# Patient Record
Sex: Female | Born: 1975 | Race: White | Hispanic: No | Marital: Married | Smoking: Former smoker
Health system: Southern US, Community
[De-identification: ages and names within clinical notes are randomized; demographics above are authoritative.]

## PROBLEM LIST (undated history)

## (undated) DIAGNOSIS — E785 Hyperlipidemia, unspecified: Secondary | ICD-10-CM

## (undated) DIAGNOSIS — Z8673 Personal history of transient ischemic attack (TIA), and cerebral infarction without residual deficits: Secondary | ICD-10-CM

## (undated) DIAGNOSIS — I10 Essential (primary) hypertension: Secondary | ICD-10-CM

## (undated) DIAGNOSIS — R519 Headache, unspecified: Secondary | ICD-10-CM

## (undated) HISTORY — DX: Headache, unspecified: R51.9

## (undated) HISTORY — DX: Personal history of transient ischemic attack (TIA), and cerebral infarction without residual deficits: Z86.73

## (undated) HISTORY — DX: Hyperlipidemia, unspecified: E78.5

## (undated) HISTORY — DX: Essential (primary) hypertension: I10

---

## 1997-11-18 ENCOUNTER — Other Ambulatory Visit: Admission: RE | Admit: 1997-11-18 | Discharge: 1997-11-18 | Payer: Self-pay | Admitting: *Deleted

## 2015-07-05 HISTORY — PX: CARDIAC CATHETERIZATION: SHX172

## 2017-04-17 DIAGNOSIS — Z0000 Encounter for general adult medical examination without abnormal findings: Secondary | ICD-10-CM

## 2017-04-17 DIAGNOSIS — E039 Hypothyroidism, unspecified: Secondary | ICD-10-CM

## 2017-04-17 HISTORY — DX: Hypothyroidism, unspecified: E03.9

## 2017-04-17 HISTORY — DX: Encounter for general adult medical examination without abnormal findings: Z00.00

## 2017-06-14 DIAGNOSIS — M5416 Radiculopathy, lumbar region: Secondary | ICD-10-CM

## 2017-06-14 DIAGNOSIS — M47816 Spondylosis without myelopathy or radiculopathy, lumbar region: Secondary | ICD-10-CM

## 2017-06-14 HISTORY — DX: Spondylosis without myelopathy or radiculopathy, lumbar region: M47.816

## 2017-06-14 HISTORY — DX: Radiculopathy, lumbar region: M54.16

## 2017-09-14 DIAGNOSIS — G43909 Migraine, unspecified, not intractable, without status migrainosus: Secondary | ICD-10-CM

## 2017-09-14 HISTORY — DX: Migraine, unspecified, not intractable, without status migrainosus: G43.909

## 2017-10-25 DIAGNOSIS — J029 Acute pharyngitis, unspecified: Secondary | ICD-10-CM

## 2017-10-25 HISTORY — DX: Acute pharyngitis, unspecified: J02.9

## 2017-11-03 DIAGNOSIS — M7582 Other shoulder lesions, left shoulder: Secondary | ICD-10-CM

## 2017-11-03 HISTORY — DX: Other shoulder lesions, left shoulder: M75.82

## 2019-01-16 DIAGNOSIS — J019 Acute sinusitis, unspecified: Secondary | ICD-10-CM | POA: Insufficient documentation

## 2019-01-16 HISTORY — DX: Acute sinusitis, unspecified: J01.90

## 2019-06-13 DIAGNOSIS — N920 Excessive and frequent menstruation with regular cycle: Secondary | ICD-10-CM | POA: Insufficient documentation

## 2019-06-13 HISTORY — DX: Excessive and frequent menstruation with regular cycle: N92.0

## 2019-11-11 DIAGNOSIS — R200 Anesthesia of skin: Secondary | ICD-10-CM | POA: Diagnosis not present

## 2019-11-11 DIAGNOSIS — D473 Essential (hemorrhagic) thrombocythemia: Secondary | ICD-10-CM

## 2019-11-11 DIAGNOSIS — G8194 Hemiplegia, unspecified affecting left nondominant side: Secondary | ICD-10-CM

## 2019-11-11 DIAGNOSIS — E876 Hypokalemia: Secondary | ICD-10-CM | POA: Diagnosis not present

## 2019-11-12 ENCOUNTER — Other Ambulatory Visit: Payer: Self-pay

## 2019-11-13 ENCOUNTER — Ambulatory Visit (INDEPENDENT_AMBULATORY_CARE_PROVIDER_SITE_OTHER): Payer: BC Managed Care – PPO

## 2019-11-13 ENCOUNTER — Ambulatory Visit: Payer: BC Managed Care – PPO | Admitting: Cardiology

## 2019-11-13 ENCOUNTER — Encounter: Payer: Self-pay | Admitting: Cardiology

## 2019-11-13 ENCOUNTER — Other Ambulatory Visit: Payer: Self-pay

## 2019-11-13 VITALS — BP 132/100 | HR 88 | Ht 66.5 in | Wt 171.0 lb

## 2019-11-13 DIAGNOSIS — Z8673 Personal history of transient ischemic attack (TIA), and cerebral infarction without residual deficits: Secondary | ICD-10-CM

## 2019-11-13 DIAGNOSIS — Q2112 Patent foramen ovale: Secondary | ICD-10-CM

## 2019-11-13 DIAGNOSIS — Q211 Atrial septal defect: Secondary | ICD-10-CM | POA: Diagnosis not present

## 2019-11-13 DIAGNOSIS — R42 Dizziness and giddiness: Secondary | ICD-10-CM | POA: Diagnosis not present

## 2019-11-13 DIAGNOSIS — I10 Essential (primary) hypertension: Secondary | ICD-10-CM | POA: Diagnosis not present

## 2019-11-13 DIAGNOSIS — G43009 Migraine without aura, not intractable, without status migrainosus: Secondary | ICD-10-CM

## 2019-11-13 MED ORDER — AMLODIPINE BESYLATE 5 MG PO TABS
5.0000 mg | ORAL_TABLET | Freq: Every day | ORAL | 3 refills | Status: DC
Start: 2019-11-13 — End: 2021-06-14

## 2019-11-13 MED ORDER — ATORVASTATIN CALCIUM 20 MG PO TABS
20.0000 mg | ORAL_TABLET | Freq: Every day | ORAL | 3 refills | Status: DC
Start: 2019-11-13 — End: 2020-11-09

## 2019-11-13 NOTE — Progress Notes (Addendum)
Cardiology Office Note:    Date:  11/17/2019   ID:  Diane Evans, DOB 10-24-1975, MRN [id number]  PCP:  Myrlene Broker, MD  Cardiologist:  Berniece Salines, DO  Electrophysiologist:  None   Referring MD: Myrlene Broker, MD   Chief Complaint  Patient presents with  . Hospitalization Follow-up  " I am doing a lot better since I left the hospital "  History of Present Illness:    Diane Evans is a 44 y.o. female with a hx of recent leg numbness where she was admitted at the [facility] with CT and MR reported to be negative, her echocardiogram showed diastolic dysfunction as well as positive bubble study noting PFO, elevated blood pressure reading, migraine headaches and hypothyroidism.  The patient tells me that at the day of the incident she was at work when she felt her left lower extremity and left upper extremity feeling numb as well.  She reported this to her coworkers who immediately took her downstairs to the emergency department after taking her blood pressure and her systolic blood pressure was noted to be [id number] her diastolic in the 0000000.  The patient reports that in addition she tells me that she has been experiencing intermittent dizziness prior to her episode.  She also reports that she has had bulging disks in her lower back which causes her for extremity problems at times.  Since she left the hospital she has not had any repeated symptoms.  Past Medical History:  Diagnosis Date  . Headache   . History of TIA (transient ischemic attack)     Past Surgical History:  Procedure Laterality Date  . CARDIAC CATHETERIZATION  2017   No stents put in    Current Medications: Current Meds  Medication Sig  . ASPIRIN 81 81 MG chewable tablet daily.  . cyclobenzaprine (FLEXERIL) 10 MG tablet Take 10 mg by mouth 3 (three) times daily.  Marland Kitchen levothyroxine (SYNTHROID) 75 MCG tablet Take by mouth daily.  . rizatriptan (MAXALT) 5 MG tablet Take by mouth daily.  . traMADol  (ULTRAM) 50 MG tablet Take by mouth as needed.     Allergies:   Patient has no known allergies.   Social History   Socioeconomic History  . Marital status: Unknown    Spouse name: Not on file  . Number of children: Not on file  . Years of education: Not on file  . Highest education level: Not on file  Occupational History  . Not on file  Tobacco Use  . Smoking status: Former Smoker    Types: Cigarettes    Quit date: 11/13/1999    Years since quitting: 20.0  . Smokeless tobacco: Never Used  Substance and Sexual Activity  . Alcohol use: Not on file    Comment: Occasional  . Drug use: Never  . Sexual activity: Not on file  Other Topics Concern  . Not on file  Social History Narrative  . Not on file   Social Determinants of Health   Financial Resource Strain:   . Difficulty of Paying Living Expenses:   Food Insecurity:   . Worried About Charity fundraiser in the Last Year:   . Arboriculturist in the Last Year:   Transportation Needs:   . Film/video editor (Medical):   Marland Kitchen Lack of Transportation (Non-Medical):   Physical Activity:   . Days of Exercise per Week:   . Minutes of Exercise per Session:   Stress:   .  Feeling of Stress :   Social Connections:   . Frequency of Communication with Friends and Family:   . Frequency of Social Gatherings with Friends and Family:   . Attends Religious Services:   . Active Member of Clubs or Organizations:   . Attends Archivist Meetings:   Marland Kitchen Marital Status:      Family History: The patient's family history includes Heart attack in her maternal grandfather; Heart murmur in her mother.  ROS:   Review of Systems  Constitution: Negative for decreased appetite, fever and weight gain.  HENT: Negative for congestion, ear discharge, hoarse voice and sore throat.   Eyes: Negative for discharge, redness, vision loss in right eye and visual halos.  Cardiovascular: Negative for chest pain, dyspnea on exertion, leg  swelling, orthopnea and palpitations.  Respiratory: Negative for cough, hemoptysis, shortness of breath and snoring.   Endocrine: Negative for heat intolerance and polyphagia.  Hematologic/Lymphatic: Negative for bleeding problem. Does not bruise/bleed easily.  Skin: Negative for flushing, nail changes, rash and suspicious lesions.  Musculoskeletal: Negative for arthritis, joint pain, muscle cramps, myalgias, neck pain and stiffness.  Gastrointestinal: Negative for abdominal pain, bowel incontinence, diarrhea and excessive appetite.  Genitourinary: Negative for decreased libido, genital sores and incomplete emptying.  Neurological: Reports headaches. Negative for brief paralysis, focal weakness and loss of balance.  Psychiatric/Behavioral: Negative for altered mental status, depression and suicidal ideas.  Allergic/Immunologic: Negative for HIV exposure and persistent infections.    EKGs/Labs/Other Studies Reviewed:    The following studies were reviewed today:   EKG:  The ekg ordered today demonstrates   TTE Impression Normal lv chamber size and wall thickness. LV EF 55-60%. Grade I Diastolic dysfunction. LA moderately dilated. PFO noted with positive bubble study.  MRI HEAD WITHOUT CONTRAST 11/1019/2 TECHNIQUE: Multiplanar, multiecho pulse sequences of the brain and surrounding structures were obtained without intravenous contrast. COMPARISON:  CT angio head neck 11/10/2019 FINDINGS: Brain: No acute infarction, hemorrhage, hydrocephalus, extra-axial collection or mass lesion. 3 mm hyperintensity left frontal deep white matter. Otherwise normal white matter. Normal brainstem.. Vascular: Normal arterial flow voids Skull and upper cervical spine: No focal skeletal lesion. Sinuses/Orbits: Negative Other: None IMPRESSION: No acute abnormality. Solitary 3 mm left frontal white matter hyperintensity is nonspecific but may be related to chronic microvascular ischemia. No features  characteristic of demyelinating disease.   CTA NECK FINDINGS 11/10/2019 Aortic arch: Examination degraded by motion artifact and timing of the contrast bolus. Visualized aortic arch of normal caliber with normal 3 vessel morphology. No flow-limiting stenosis seen about the origin of the great vessels. Visualized subclavian arteries widely patent. Right carotid system: Right common and internal carotid arteries grossly patent without stenosis, dissection, or occlusion. Evaluation limited by motion. Left carotid system: Left common and internal carotid arteries grossly patent without stenosis, dissection or occlusion. Evaluation limited by motion. Vertebral arteries: Both vertebral arteries arise from the subclavian arteries. Left vertebral artery dominant. Vertebral arteries patent without stenosis, dissection or occlusion. Evaluation limited by motion. Skeleton: No acute osseous abnormality. No visible discrete osseous lesions. Other neck: No other acute soft tissue abnormality within the neck. Upper chest: Visualized upper chest demonstrates no acute finding. Review of the MIP images confirms the above findings  CTA HEAD FINDINGS  Anterior circulation: Examination markedly degraded by motion and timing of the contrast bolus.  Internal carotid arteries grossly patent to the termini without appreciable stenosis or other abnormality. A1 segments patent. Normal anterior communicating artery complex anterior cerebral arteries grossly  patent to their distal aspects without stenosis. No M1 stenosis or occlusion. Normal MCA bifurcations. Distal MCA branches well perfused and symmetric.  Posterior circulation: Vertebral arteries patent to the vertebrobasilar junction without stenosis. Both picas patent. Basilar patent to its distal aspect without stenosis. Superior cerebral arteries patent bilaterally. Both PCAs primarily supplied via the basilar well perfused to their distal  aspects. Venous sinuses: Patent. Anatomic variants: None significant. Review of the MIP images confirms the above findings IMPRESSION: 1. Technically limited exam due to motion artifact and timing the contrast bolus. 2. Grossly negative CTA of the neck. No large vessel occlusion, hemodynamically significant stenosis, or other acute vascular abnormality.    Recent Labs: CBC: WBC 7.8, hemoglobin 13.4, hematocrit 40.8, platelet 421 Chemistry: Sodium 140, potassium 3.4, chloride 100, bicarb 28, creatinine 0.80, BUN 13, glucose 98 Recent Lipid Panel No results found for: CHOL, TRIG, HDL, CHOLHDL, VLDL, LDLCALC, LDLDIRECT  Physical Exam:    VS:  BP (!) 132/100 (BP Location: Left Arm, Patient Position: Sitting, Cuff Size: Normal)   Pulse 88   Ht 5' 6.5" (1.689 m)   Wt 171 lb (77.6 kg)   SpO2 99%   BMI 27.19 kg/m     Wt Readings from Last 3 Encounters:  11/13/19 171 lb (77.6 kg)     GEN: Well nourished, well developed in no acute distress HEENT: Normal NECK: No JVD; No carotid bruits LYMPHATICS: No lymphadenopathy CARDIAC: S1S2 noted,RRR, no murmurs, rubs, gallops RESPIRATORY:  Clear to auscultation without rales, wheezing or rhonchi  ABDOMEN: Soft, non-tender, non-distended, +bowel sounds, no guarding. EXTREMITIES: No edema, No cyanosis, no clubbing MUSCULOSKELETAL:  No deformity  SKIN: Warm and dry, skin with tattoos NEUROLOGIC:  Alert and oriented x 3, non-focal PSYCHIATRIC:  Normal affect, good insight  ASSESSMENT:    1. Dizziness   2. History of TIA (transient ischemic attack)   3. PFO (patent foramen ovale)   4. Essential hypertension   5. Migraine without aura and without status migrainosus, not intractable    PLAN:    For her dizziness, I will place a monitor on the patient to help understand and make sure that she does not have any cardiac arhythmia that may be causing her symptoms. I will place the monitor on the patient for 14 days.  She is hypertensive in  the office - I do believe that the patient have hypertension given her two isolated elevated blood pressure reading - so I am going to start her on Amlodipine 5mg  daily.   Her MRI did not show any acute infarction but have some suspicion of nonspecific finding which could be related to chronic microvascular ischemia in her left frontal white matter.  She was placed on aspirin for history of TIA some also I am  going to add atorvastatin 20 mg daily.   I think she should see a neurologist for now for her migraine headaches - as her episode may have been as a result of complicated migraine.  She was concerned about her migraines and PFO findings I did discuss with the patient that patients with PFO do sometimes have migraines.  But at this time I do think she should consider medical therapy and if refractory then we can pursue the possibility of PFO closure . At which time a TEE will be performed prior to evaluation by a structural/interventional cardiologist. In the meantime she will follow up with a neurologist.  For now we have discussed even with her wife on the phone that TEE  at this time would not change her plan of care- since she has not been treated medically for her migraines.  The patient her wife both agree with this.  The patient is in agreement with the above plan. The patient left the office in stable condition.  The patient will follow up in 1 month.   Medication Adjustments/Labs and Tests Ordered: Current medicines are reviewed at length with the patient today.  Concerns regarding medicines are outlined above.  Orders Placed This Encounter  Procedures  . LONG TERM MONITOR (3-14 DAYS)  . EKG 12-Lead   Meds ordered this encounter  Medications  . amLODipine (NORVASC) 5 MG tablet    Sig: Take 1 tablet (5 mg total) by mouth daily.    Dispense:  90 tablet    Refill:  3  . atorvastatin (LIPITOR) 20 MG tablet    Sig: Take 1 tablet (20 mg total) by mouth daily.    Dispense:  90  tablet    Refill:  3    Patient Instructions  Medication Instructions:  Your physician has recommended you make the following change in your medication:  START: Amlodipine 5 mg take one tablet by mouth daily.  START: Lipitor 20 mg take one tablet by mouth daily.  *If you need a refill on your cardiac medications before your next appointment, please call your pharmacy*   Lab Work: None If you have labs (blood work) drawn today and your tests are completely normal, you will receive your results only by: Marland Kitchen MyChart Message (if you have MyChart) OR . A paper copy in the mail If you have any lab test that is abnormal or we need to change your treatment, we will call you to review the results.   Testing/Procedures: A zio monitor was ordered today. It will remain on for 14 days. You will then return monitor and event diary in provided box. It takes 1-2 weeks for report to be downloaded and returned to [place]. We will call you with the results. If monitor falls off or has orange flashing light, please call Zio for further instructions.      Follow-Up: At [facility], you and your health needs are our priority.  As part of our continuing mission to provide you with exceptional heart care, we have created designated Provider Care Teams.  These Care Teams include your primary Cardiologist (physician) and Advanced Practice Providers (APPs -  Physician Assistants and Nurse Practitioners) who all work together to provide you with the care you need, when you need it.  We recommend signing up for the patient portal called "MyChart".  Sign up information is provided on this After Visit Summary.  MyChart is used to connect with patients for Virtual Visits (Telemedicine).  Patients are able to view lab/test results, encounter notes, upcoming appointments, etc.  Non-urgent messages can be sent to your provider as well.   To learn more about what you can do with MyChart, go to [email or web address].     Your next appointment:   1 month(s)  The format for your next appointment:   In Person  Provider:   Berniece Salines, DO   Other Instructions      Adopting a Healthy Lifestyle.  Know what a healthy weight is for you (roughly BMI <25) and aim to maintain this   Aim for 7+ servings of fruits and vegetables daily   65-80+ fluid ounces of water or unsweet tea for healthy kidneys   Limit to max 1 drink of  alcohol per day; avoid smoking/tobacco   Limit animal fats in diet for cholesterol and heart health - choose grass fed whenever available   Avoid highly processed foods, and foods high in saturated/trans fats   Aim for low stress - take time to unwind and care for your mental health   Aim for 150 min of moderate intensity exercise weekly for heart health, and weights twice weekly for bone health   Aim for 7-9 hours of sleep daily   When it comes to diets, agreement about the perfect plan isnt easy to find, even among the experts. Experts at the [employer] developed an idea known as the Healthy Eating Plate. Just imagine a plate divided into logical, healthy portions.   The emphasis is on diet quality:   Load up on vegetables and fruits - one-half of your plate: Aim for color and variety, and remember that potatoes dont count.   Go for whole grains - one-quarter of your plate: Whole wheat, barley, wheat berries, quinoa, oats, brown rice, and foods made with them. If you want pasta, go with whole wheat pasta.   Protein power - one-quarter of your plate: Fish, chicken, beans, and nuts are all healthy, versatile protein sources. Limit red meat.   The diet, however, does go beyond the plate, offering a few other suggestions.   Use healthy plant oils, such as olive, canola, soy, corn, sunflower and peanut. Check the labels, and avoid partially hydrogenated oil, which have unhealthy trans fats.   If youre thirsty, drink water. Coffee and tea are good in  moderation, but skip sugary drinks and limit milk and dairy products to one or two daily servings.   The type of carbohydrate in the diet is more important than the amount. Some sources of carbohydrates, such as vegetables, fruits, whole grains, and beans-are healthier than others.   Finally, stay active  Signed, Berniece Salines, DO  11/17/2019 9:10 AM    [place]

## 2019-11-13 NOTE — Patient Instructions (Signed)
Medication Instructions:  Your physician has recommended you make the following change in your medication:  START: Amlodipine 5 mg take one tablet by mouth daily.  START: Lipitor 20 mg take one tablet by mouth daily.  *If you need a refill on your cardiac medications before your next appointment, please call your pharmacy*   Lab Work: None If you have labs (blood work) drawn today and your tests are completely normal, you will receive your results only by: Marland Kitchen MyChart Message (if you have MyChart) OR . A paper copy in the mail If you have any lab test that is abnormal or we need to change your treatment, we will call you to review the results.   Testing/Procedures: A zio monitor was ordered today. It will remain on for 14 days. You will then return monitor and event diary in provided box. It takes 1-2 weeks for report to be downloaded and returned to [place]. We will call you with the results. If monitor falls off or has orange flashing light, please call Zio for further instructions.      Follow-Up: At [facility], you and your health needs are our priority.  As part of our continuing mission to provide you with exceptional heart care, we have created designated Provider Care Teams.  These Care Teams include your primary Cardiologist (physician) and Advanced Practice Providers (APPs -  Physician Assistants and Nurse Practitioners) who all work together to provide you with the care you need, when you need it.  We recommend signing up for the patient portal called "MyChart".  Sign up information is provided on this After Visit Summary.  MyChart is used to connect with patients for Virtual Visits (Telemedicine).  Patients are able to view lab/test results, encounter notes, upcoming appointments, etc.  Non-urgent messages can be sent to your provider as well.   To learn more about what you can do with MyChart, go to [email or web address].    Your next appointment:   1 month(s)  The format  for your next appointment:   In Person  Provider:   Berniece Salines, DO   Other Instructions

## 2019-11-18 DIAGNOSIS — Z09 Encounter for follow-up examination after completed treatment for conditions other than malignant neoplasm: Secondary | ICD-10-CM

## 2019-11-18 DIAGNOSIS — D75838 Other thrombocytosis: Secondary | ICD-10-CM

## 2019-11-18 DIAGNOSIS — E876 Hypokalemia: Secondary | ICD-10-CM

## 2019-11-18 HISTORY — DX: Other thrombocytosis: D75.838

## 2019-11-18 HISTORY — DX: Encounter for follow-up examination after completed treatment for conditions other than malignant neoplasm: Z09

## 2019-11-18 HISTORY — DX: Hypokalemia: E87.6

## 2019-12-13 ENCOUNTER — Encounter: Payer: Self-pay | Admitting: Cardiology

## 2019-12-13 ENCOUNTER — Ambulatory Visit: Payer: BC Managed Care – PPO | Admitting: Cardiology

## 2019-12-13 ENCOUNTER — Other Ambulatory Visit: Payer: Self-pay

## 2019-12-13 VITALS — BP 110/64 | HR 71 | Ht 66.5 in | Wt 164.0 lb

## 2019-12-13 DIAGNOSIS — I10 Essential (primary) hypertension: Secondary | ICD-10-CM | POA: Diagnosis not present

## 2019-12-13 DIAGNOSIS — Q2112 Patent foramen ovale: Secondary | ICD-10-CM

## 2019-12-13 DIAGNOSIS — Z8673 Personal history of transient ischemic attack (TIA), and cerebral infarction without residual deficits: Secondary | ICD-10-CM | POA: Diagnosis not present

## 2019-12-13 DIAGNOSIS — Q211 Atrial septal defect: Secondary | ICD-10-CM | POA: Diagnosis not present

## 2019-12-13 MED ORDER — ASPIRIN 81 81 MG PO CHEW: 81.0000 mg | CHEWABLE_TABLET | Freq: Every day | ORAL | 3 refills | Status: DC

## 2019-12-13 NOTE — Patient Instructions (Signed)
Medication Instructions:  Your physician recommends that you continue on your current medications as directed. Please refer to the Current Medication list given to you today.  *If you need a refill on your cardiac medications before your next appointment, please call your pharmacy*   Lab Work: None ordered   If you have labs (blood work) drawn today and your tests are completely normal, you will receive your results only by: . MyChart Message (if you have MyChart) OR . A paper copy in the mail If you have any lab test that is abnormal or we need to change your treatment, we will call you to review the results.   Testing/Procedures: None ordered    Follow-Up: At [facility], you and your health needs are our priority.  As part of our continuing mission to provide you with exceptional heart care, we have created designated Provider Care Teams.  These Care Teams include your primary Cardiologist (physician) and Advanced Practice Providers (APPs -  Physician Assistants and Nurse Practitioners) who all work together to provide you with the care you need, when you need it.  We recommend signing up for the patient portal called "MyChart".  Sign up information is provided on this After Visit Summary.  MyChart is used to connect with patients for Virtual Visits (Telemedicine).  Patients are able to view lab/test results, encounter notes, upcoming appointments, etc.  Non-urgent messages can be sent to your provider as well.   To learn more about what you can do with MyChart, go to [email or web address].    Your next appointment:   6 month(s)  The format for your next appointment:   In Person  Provider:   Kardie Tobb, DO   Other Instructions None   

## 2019-12-13 NOTE — Progress Notes (Signed)
Cardiology Office Note:    Date:  12/13/2019   ID:  Diane Evans, DOB 1976/07/03, MRN [id number]  PCP:  Myrlene Broker, MD  Cardiologist:  Berniece Salines, DO  Electrophysiologist:  None   Referring MD: Myrlene Broker, MD   " I am doing well"  History of Present Illness:    Diane Evans is a 44 y.o. female with a hx of hypertension, hypothyroidism, migraine headaches, recent concern for TIA which was admitted to [facility] in May 2012 where her echo showed evidence of positive bubble study noting a PFO.  I did see the patient on Nov 13, 2019 at the time she presented to establish cardiac care.  She was hypertensive I started her on amlodipine 5 mg daily.  Also started patient on aspirin and Lipitor 20 mg daily.  We discussed at that time that it would be beneficial for her to pursue medical management for her migraine headaches and if this fails we will pursue investigating her PFO for possible closure.  She preferred not to pursue any TEE.  Today she is here for follow-up visit she tells me that she has been doing well and her blood pressure has significant increase on the amlodipine 5 mg daily.  She has not been able to see neurology.  She offers no complaints at this time.  Past Medical History:  Diagnosis Date  . Headache   . History of TIA (transient ischemic attack)     Past Surgical History:  Procedure Laterality Date  . CARDIAC CATHETERIZATION  2017   No stents put in    Current Medications: Current Meds  Medication Sig  . amLODipine (NORVASC) 5 MG tablet Take 1 tablet (5 mg total) by mouth daily.  . ASPIRIN 81 81 MG chewable tablet Chew 1 tablet (81 mg total) by mouth daily.  Marland Kitchen atorvastatin (LIPITOR) 20 MG tablet Take 1 tablet (20 mg total) by mouth daily.  . cyclobenzaprine (FLEXERIL) 10 MG tablet Take 10 mg by mouth 3 (three) times daily.  Marland Kitchen levothyroxine (SYNTHROID) 75 MCG tablet Take by mouth daily.  . rizatriptan (MAXALT) 5 MG tablet Take by mouth  daily.  . traMADol (ULTRAM) 50 MG tablet Take by mouth as needed.  . [DISCONTINUED] ASPIRIN 81 81 MG chewable tablet daily.     Allergies:   Patient has no known allergies.   Social History   Socioeconomic History  . Marital status: Unknown    Spouse name: Not on file  . Number of children: Not on file  . Years of education: Not on file  . Highest education level: Not on file  Occupational History  . Not on file  Tobacco Use  . Smoking status: Former Smoker    Types: Cigarettes    Quit date: 11/13/1999    Years since quitting: 20.0  . Smokeless tobacco: Never Used  Substance and Sexual Activity  . Alcohol use: Not on file    Comment: Occasional  . Drug use: Never  . Sexual activity: Not on file  Other Topics Concern  . Not on file  Social History Narrative  . Not on file   Social Determinants of Health   Financial Resource Strain:   . Difficulty of Paying Living Expenses:   Food Insecurity:   . Worried About Charity fundraiser in the Last Year:   . Arboriculturist in the Last Year:   Transportation Needs:   . Film/video editor (Medical):   Marland Kitchen Lack of  Transportation (Non-Medical):   Physical Activity:   . Days of Exercise per Week:   . Minutes of Exercise per Session:   Stress:   . Feeling of Stress :   Social Connections:   . Frequency of Communication with Friends and Family:   . Frequency of Social Gatherings with Friends and Family:   . Attends Religious Services:   . Active Member of Clubs or Organizations:   . Attends Archivist Meetings:   Marland Kitchen Marital Status:      Family History: The patient's family history includes Heart attack in her maternal grandfather; Heart murmur in her mother.  ROS:   Review of Systems  Constitution: Negative for decreased appetite, fever and weight gain.  HENT: Negative for congestion, ear discharge, hoarse voice and sore throat.   Eyes: Negative for discharge, redness, vision loss in right eye and visual  halos.  Cardiovascular: Negative for chest pain, dyspnea on exertion, leg swelling, orthopnea and palpitations.  Respiratory: Negative for cough, hemoptysis, shortness of breath and snoring.   Endocrine: Negative for heat intolerance and polyphagia.  Hematologic/Lymphatic: Negative for bleeding problem. Does not bruise/bleed easily.  Skin: Negative for flushing, nail changes, rash and suspicious lesions.  Musculoskeletal: Negative for arthritis, joint pain, muscle cramps, myalgias, neck pain and stiffness.  Gastrointestinal: Negative for abdominal pain, bowel incontinence, diarrhea and excessive appetite.  Genitourinary: Negative for decreased libido, genital sores and incomplete emptying.  Neurological: Negative for brief paralysis, focal weakness, headaches and loss of balance.  Psychiatric/Behavioral: Negative for altered mental status, depression and suicidal ideas.  Allergic/Immunologic: Negative for HIV exposure and persistent infections.    EKGs/Labs/Other Studies Reviewed:    The following studies were reviewed today:   EKG:  None toda [place]   TTE Impression Normal lv chamber size and wall thickness. LV EF 55-60%. Grade I Diastolic dysfunction. LA moderately dilated. PFO noted with positive bubble study.  MRI HEAD WITHOUT CONTRAST 11/1019/2 TECHNIQUE: Multiplanar, multiecho pulse sequences of the brain and surrounding structures were obtained without intravenous contrast. COMPARISON:  CT angio head neck 11/10/2019 FINDINGS: Brain: No acute infarction, hemorrhage, hydrocephalus, extra-axial collection or mass lesion. 3 mm hyperintensity left frontal deep white matter. Otherwise normal white matter. Normal brainstem.. Vascular: Normal arterial flow voids Skull and upper cervical spine: No focal skeletal lesion. Sinuses/Orbits: Negative Other: None IMPRESSION: No acute abnormality. Solitary 3 mm left frontal white matter hyperintensity is nonspecific but may be related to  chronic microvascular ischemia. No features characteristic of demyelinating disease.   CTA NECK FINDINGS 11/10/2019 Aortic arch: Examination degraded by motion artifact and timing of the contrast bolus. Visualized aortic arch of normal caliber with normal 3 vessel morphology. No flow-limiting stenosis seen about the origin of the great vessels. Visualized subclavian arteries widely patent. Right carotid system: Right common and internal carotid arteries grossly patent without stenosis, dissection, or occlusion. Evaluation limited by motion. Left carotid system: Left common and internal carotid arteries grossly patent without stenosis, dissection or occlusion. Evaluation limited by motion. Vertebral arteries: Both vertebral arteries arise from the subclavian arteries. Left vertebral artery dominant. Vertebral arteries patent without stenosis, dissection or occlusion. Evaluation limited by motion. Skeleton: No acute osseous abnormality. No visible discrete osseous lesions. Other neck: No other acute soft tissue abnormality within the neck. Upper chest: Visualized upper chest demonstrates no acute finding. Review of the MIP images confirms the above findings  CTA HEAD FINDINGS  Anterior circulation: Examination markedly degraded by motion and timing of the contrast bolus.  Internal  carotid arteries grossly patent to the termini without appreciable stenosis or other abnormality. A1 segments patent. Normal anterior communicating artery complex anterior cerebral arteries grossly patent to their distal aspects without stenosis. No M1 stenosis or occlusion. Normal MCA bifurcations. Distal MCA branches well perfused and symmetric.  Posterior circulation: Vertebral arteries patent to the vertebrobasilar junction without stenosis. Both picas patent. Basilar patent to its distal aspect without stenosis. Superior cerebral arteries patent bilaterally. Both PCAs primarily supplied via the  basilar well perfused to their distal aspects. Venous sinuses: Patent. Anatomic variants: None significant. Review of the MIP images confirms the above findings IMPRESSION: 1. Technically limited exam due to motion artifact and timing the contrast bolus. 2. Grossly negative CTA of the neck. No large vessel occlusion, hemodynamically significant stenosis, or other acute vascular abnormality. Recent Labs: No results found for requested labs within last 8760 hours.  Recent Lipid Panel No results found for: CHOL, TRIG, HDL, CHOLHDL, VLDL, LDLCALC, LDLDIRECT  Physical Exam:    VS:  BP 110/64   Pulse 71   Ht 5' 6.5" (1.689 m)   Wt 164 lb (74.4 kg)   BMI 26.07 kg/m     Wt Readings from Last 3 Encounters:  12/13/19 164 lb (74.4 kg)  11/13/19 171 lb (77.6 kg)     GEN: Well nourished, well developed in no acute distress HEENT: Normal NECK: No JVD; No carotid bruits LYMPHATICS: No lymphadenopathy CARDIAC: S1S2 noted,RRR, no murmurs, rubs, gallops RESPIRATORY:  Clear to auscultation without rales, wheezing or rhonchi  ABDOMEN: Soft, non-tender, non-distended, +bowel sounds, no guarding. EXTREMITIES: No edema, No cyanosis, no clubbing MUSCULOSKELETAL:  No deformity  SKIN: Warm and dry NEUROLOGIC:  Alert and oriented x 3, non-focal PSYCHIATRIC:  Normal affect, good insight  ASSESSMENT:    1. Essential hypertension   2. History of TIA (transient ischemic attack)   3. PFO (patent foramen ovale)    PLAN:     1.  Her blood pressure is acceptable in the office today.  We will continue the current dose of amlodipine 5 mg daily.  2.  We will continue the aspirin and statin for her TIA.  3.  We spoke again about her positive bubble study with a PFO.  And at this time she still maintains that is best and would rather follow-up with neurology for migraine treatment medically.  The patient is in agreement with the above plan. The patient left the office in stable condition.  The  patient will follow up in   Medication Adjustments/Labs and Tests Ordered: Current medicines are reviewed at length with the patient today.  Concerns regarding medicines are outlined above.  No orders of the defined types were placed in this encounter.  Meds ordered this encounter  Medications  . ASPIRIN 81 81 MG chewable tablet    Sig: Chew 1 tablet (81 mg total) by mouth daily.    Dispense:  90 tablet    Refill:  3    Patient Instructions  Medication Instructions:  Your physician recommends that you continue on your current medications as directed. Please refer to the Current Medication list given to you today.  *If you need a refill on your cardiac medications before your next appointment, please call your pharmacy*   Lab Work: None ordered   If you have labs (blood work) drawn today and your tests are completely normal, you will receive your results only by: Marland Kitchen MyChart Message (if you have MyChart) OR . A paper copy in the mail  If you have any lab test that is abnormal or we need to change your treatment, we will call you to review the results.   Testing/Procedures: None ordered    Follow-Up: At [facility], you and your health needs are our priority.  As part of our continuing mission to provide you with exceptional heart care, we have created designated Provider Care Teams.  These Care Teams include your primary Cardiologist (physician) and Advanced Practice Providers (APPs -  Physician Assistants and Nurse Practitioners) who all work together to provide you with the care you need, when you need it.  We recommend signing up for the patient portal called "MyChart".  Sign up information is provided on this After Visit Summary.  MyChart is used to connect with patients for Virtual Visits (Telemedicine).  Patients are able to view lab/test results, encounter notes, upcoming appointments, etc.  Non-urgent messages can be sent to your provider as well.   To learn more about  what you can do with MyChart, go to [email or web address].    Your next appointment:   6 month(s)  The format for your next appointment:   In Person  Provider:   Berniece Salines, DO   Other Instructions None      Adopting a Healthy Lifestyle.  Know what a healthy weight is for you (roughly BMI <25) and aim to maintain this   Aim for 7+ servings of fruits and vegetables daily   65-80+ fluid ounces of water or unsweet tea for healthy kidneys   Limit to max 1 drink of alcohol per day; avoid smoking/tobacco   Limit animal fats in diet for cholesterol and heart health - choose grass fed whenever available   Avoid highly processed foods, and foods high in saturated/trans fats   Aim for low stress - take time to unwind and care for your mental health   Aim for 150 min of moderate intensity exercise weekly for heart health, and weights twice weekly for bone health   Aim for 7-9 hours of sleep daily   When it comes to diets, agreement about the perfect plan isnt easy to find, even among the experts. Experts at the [employer] developed an idea known as the Healthy Eating Plate. Just imagine a plate divided into logical, healthy portions.   The emphasis is on diet quality:   Load up on vegetables and fruits - one-half of your plate: Aim for color and variety, and remember that potatoes dont count.   Go for whole grains - one-quarter of your plate: Whole wheat, barley, wheat berries, quinoa, oats, brown rice, and foods made with them. If you want pasta, go with whole wheat pasta.   Protein power - one-quarter of your plate: Fish, chicken, beans, and nuts are all healthy, versatile protein sources. Limit red meat.   The diet, however, does go beyond the plate, offering a few other suggestions.   Use healthy plant oils, such as olive, canola, soy, corn, sunflower and peanut. Check the labels, and avoid partially hydrogenated oil, which have unhealthy trans  fats.   If youre thirsty, drink water. Coffee and tea are good in moderation, but skip sugary drinks and limit milk and dairy products to one or two daily servings.   The type of carbohydrate in the diet is more important than the amount. Some sources of carbohydrates, such as vegetables, fruits, whole grains, and beans-are healthier than others.   Finally, stay active  Signed, Berniece Salines, DO  12/13/2019  8:54 PM    [facility]

## 2020-01-07 ENCOUNTER — Telehealth: Payer: Self-pay

## 2020-01-07 NOTE — Telephone Encounter (Signed)
Spoke with patient regarding results and recommendation.  Patient verbalizes understanding and is agreeable to plan of care. Advised patient to call back with any issues or concerns.  

## 2020-01-07 NOTE — Telephone Encounter (Signed)
-----   Message from Berniece Salines, DO sent at 01/03/2020 11:27 PM EDT ----- Normal study

## 2020-02-25 ENCOUNTER — Telehealth: Payer: Self-pay | Admitting: Cardiology

## 2020-02-25 ENCOUNTER — Telehealth: Payer: Self-pay

## 2020-02-25 NOTE — Telephone Encounter (Signed)
Diane Evans is calling stating Dr. Harriet Masson advised her she was referring her to a Neurologist, but states she has never heard anything from the referring office in regards to it. Please advise.

## 2020-02-25 NOTE — Telephone Encounter (Signed)
Information as per Dr.Tobb was given to pt. Pt verbalized understanding and had no additional questions.

## 2020-02-25 NOTE — Telephone Encounter (Signed)
When I first saw her back in May I did mention to her that for her complicated migraine she needed to see neurology.  Maybe just a misunderstanding but I did also tell her that her it will be best PCP refer her for this.

## 2020-02-25 NOTE — Telephone Encounter (Signed)
I do not see anything in your note that we would be the referring provider for neurology. How do you advise?

## 2020-04-17 DIAGNOSIS — K5900 Constipation, unspecified: Secondary | ICD-10-CM

## 2020-04-17 DIAGNOSIS — U071 COVID-19: Secondary | ICD-10-CM | POA: Insufficient documentation

## 2020-04-17 HISTORY — DX: Constipation, unspecified: K59.00

## 2020-04-17 HISTORY — DX: COVID-19: U07.1

## 2020-05-04 DIAGNOSIS — J40 Bronchitis, not specified as acute or chronic: Secondary | ICD-10-CM

## 2020-05-04 DIAGNOSIS — R110 Nausea: Secondary | ICD-10-CM

## 2020-05-04 HISTORY — DX: Nausea: R11.0

## 2020-05-04 HISTORY — DX: Bronchitis, not specified as acute or chronic: J40

## 2020-05-19 ENCOUNTER — Other Ambulatory Visit: Payer: Self-pay

## 2020-05-19 DIAGNOSIS — R519 Headache, unspecified: Secondary | ICD-10-CM | POA: Insufficient documentation

## 2020-05-19 DIAGNOSIS — Z8673 Personal history of transient ischemic attack (TIA), and cerebral infarction without residual deficits: Secondary | ICD-10-CM | POA: Insufficient documentation

## 2020-05-20 ENCOUNTER — Encounter: Payer: Self-pay | Admitting: Cardiology

## 2020-05-20 ENCOUNTER — Other Ambulatory Visit: Payer: Self-pay

## 2020-05-20 ENCOUNTER — Ambulatory Visit (INDEPENDENT_AMBULATORY_CARE_PROVIDER_SITE_OTHER): Payer: BC Managed Care – PPO | Admitting: Cardiology

## 2020-05-20 VITALS — BP 138/78 | HR 100 | Ht 66.0 in | Wt 156.4 lb

## 2020-05-20 DIAGNOSIS — I10 Essential (primary) hypertension: Secondary | ICD-10-CM

## 2020-05-20 DIAGNOSIS — M7989 Other specified soft tissue disorders: Secondary | ICD-10-CM | POA: Diagnosis not present

## 2020-05-20 NOTE — Progress Notes (Signed)
Cardiology Office Note:    Date:  05/20/2020   ID:  Diane Evans, DOB Feb 01, 1976, MRN [id number]  PCP:  Myrlene Broker, MD  Cardiologist:  Berniece Salines, DO  Electrophysiologist:  None   Referring MD: Myrlene Broker, MD   " I am doing fine from a cardiovascular standpoint"  History of Present Illness:    Diane Evans is a 44 y.o. female with a hx of hypertension, hypothyroidism, migraine headaches, recent concern for TIA which was admitted to [facility] in May 2012 where her echo showed evidence of positive bubble study noting a PFO.  I did see the patient on Nov 13, 2019 at the time she presented to establish cardiac care.  She was hypertensive I started her on amlodipine 5 mg daily.  Also started patient on aspirin and Lipitor 20 mg daily.  We discussed at that time that it would be beneficial for her to pursue medical management for her migraine headaches and if this fails we will pursue investigating her PFO for possible closure.  She preferred not to pursue any TEE.  At her last visit in June 2021 we continue her amlodipine aspirin as well as her statin.  We did again speak about her PFO bubble study and the patient preferred to not pursue any TEE.  In the interim she has seen neurology who started her on Topamax for her migraine headaches.  Patient tells me that his medicine is making her sick and she has had significant mood swings with this.  They are planning on titrating her off this medication.  She also complained that on her right chest wall she had a mass which she feels within her muscle and has been getting bigger.  She is here today with her wife.   Past Medical History:  Diagnosis Date  . Acute non-recurrent sinusitis 01/16/2019  . Bronchitis 05/04/2020  . Constipation 04/17/2020  . COVID-19 04/17/2020  . Encounter for annual physical examination excluding gynecological examination in a patient older than 17 years 04/17/2017  . Headache   . History of TIA  (transient ischemic attack)   . Hospital discharge follow-up 11/18/2019  . Hypokalemia 11/18/2019  . Hypothyroidism (acquired) 04/17/2017  . Lumbar radicular pain 06/14/2017  . Migraine without status migrainosus, not intractable 09/14/2017  . Nausea 05/04/2020  . Polymenorrhea 06/13/2019  . Reactive thrombocytosis 11/18/2019  . Sore throat 10/25/2017  . Spondylosis of lumbar spine 06/14/2017  . Tendinitis of left rotator cuff 11/03/2017    Past Surgical History:  Procedure Laterality Date  . CARDIAC CATHETERIZATION  2017   No stents put in    Current Medications: Current Meds  Medication Sig  . albuterol (VENTOLIN HFA) 108 (90 Base) MCG/ACT inhaler Inhale into the lungs.  Marland Kitchen amLODipine (NORVASC) 5 MG tablet Take 1 tablet (5 mg total) by mouth daily.  . ASPIRIN 81 81 MG chewable tablet Chew 1 tablet (81 mg total) by mouth daily.  Marland Kitchen atorvastatin (LIPITOR) 20 MG tablet Take 1 tablet (20 mg total) by mouth daily.  Marland Kitchen azithromycin (ZITHROMAX) 500 MG tablet Take 500 mg by mouth daily.  . budesonide-formoterol (SYMBICORT) 160-4.5 MCG/ACT inhaler Inhale into the lungs.  . cyclobenzaprine (FLEXERIL) 10 MG tablet Take 10 mg by mouth 3 (three) times daily.  . fluconazole (DIFLUCAN) 150 MG tablet 1 now by mouth for yeast infection.  Repeat in 1 week.  . levothyroxine (SYNTHROID) 75 MCG tablet Take by mouth daily.  Marland Kitchen linaclotide (LINZESS) 72 MCG capsule Take by mouth.  Marland Kitchen  nitrofurantoin, macrocrystal-monohydrate, (MACROBID) 100 MG capsule Take 100 mg by mouth 2 (two) times daily.  . ondansetron (ZOFRAN-ODT) 4 MG disintegrating tablet Take 4 mg by mouth every 8 (eight) hours as needed.  . predniSONE (DELTASONE) 20 MG tablet Take 20 mg by mouth 2 (two) times daily.  . rizatriptan (MAXALT) 5 MG tablet Take by mouth daily.  Marland Kitchen topiramate (TOPAMAX) 25 MG tablet Take one tablet for 2 weeks then increase to two tablets daily  . traMADol (ULTRAM) 50 MG tablet Take by mouth as needed.     Allergies:    Patient has no known allergies.   Social History   Socioeconomic History  . Marital status: Unknown    Spouse name: Not on file  . Number of children: Not on file  . Years of education: Not on file  . Highest education level: Not on file  Occupational History  . Not on file  Tobacco Use  . Smoking status: Former Smoker    Types: Cigarettes    Quit date: 11/13/1999    Years since quitting: 20.5  . Smokeless tobacco: Never Used  Substance and Sexual Activity  . Alcohol use: Not on file    Comment: Occasional  . Drug use: Never  . Sexual activity: Not on file  Other Topics Concern  . Not on file  Social History Narrative  . Not on file   Social Determinants of Health   Financial Resource Strain:   . Difficulty of Paying Living Expenses: Not on file  Food Insecurity:   . Worried About Charity fundraiser in the Last Year: Not on file  . Ran Out of Food in the Last Year: Not on file  Transportation Needs:   . Lack of Transportation (Medical): Not on file  . Lack of Transportation (Non-Medical): Not on file  Physical Activity:   . Days of Exercise per Week: Not on file  . Minutes of Exercise per Session: Not on file  Stress:   . Feeling of Stress : Not on file  Social Connections:   . Frequency of Communication with Friends and Family: Not on file  . Frequency of Social Gatherings with Friends and Family: Not on file  . Attends Religious Services: Not on file  . Active Member of Clubs or Organizations: Not on file  . Attends Archivist Meetings: Not on file  . Marital Status: Not on file     Family History: The patient's family history includes Heart attack in her maternal grandfather; Heart murmur in her mother.  ROS:   Review of Systems  Constitution: Negative for decreased appetite, fever and weight gain.  HENT: Negative for congestion, ear discharge, hoarse voice and sore throat.   Eyes: Negative for discharge, redness, vision loss in right eye and  visual halos.  Cardiovascular: Negative for chest pain, dyspnea on exertion, leg swelling, orthopnea and palpitations.  Respiratory: Negative for cough, hemoptysis, shortness of breath and snoring.   Endocrine: Negative for heat intolerance and polyphagia.  Hematologic/Lymphatic: Negative for bleeding problem. Does not bruise/bleed easily.  Skin: Negative for flushing, nail changes, rash and suspicious lesions.  Musculoskeletal: Negative for arthritis, joint pain, muscle cramps, myalgias, neck pain and stiffness.  Gastrointestinal: Negative for abdominal pain, bowel incontinence, diarrhea and excessive appetite.  Genitourinary: Negative for decreased libido, genital sores and incomplete emptying.  Neurological: Negative for brief paralysis, focal weakness, headaches and loss of balance.  Psychiatric/Behavioral: Negative for altered mental status, depression and suicidal ideas.  Allergic/Immunologic:  Negative for HIV exposure and persistent infections.    EKGs/Labs/Other Studies Reviewed:    The following studies were reviewed today:   EKG: None today  TTE Impression Normal lv chamber size and wall thickness. LV EF 55-60%. Grade I Diastolic dysfunction. LA moderately dilated. PFO noted with positive bubble study.  MRI HEAD WITHOUT CONTRAST5/1021/2 TECHNIQUE: Multiplanar, multiecho pulse sequences of the brain and surrounding structures were obtained without intravenous contrast. COMPARISON: CT angio head neck 11/10/2019 FINDINGS: Brain: No acute infarction, hemorrhage, hydrocephalus, extra-axial collection or mass lesion. 3 mm hyperintensity left frontal deep white matter. Otherwise normal white matter. Normal brainstem.. Vascular: Normal arterial flow voids Skull and upper cervical spine: No focal skeletal lesion. Sinuses/Orbits: Negative Other: None IMPRESSION: No acute abnormality. Solitary 3 mm left frontal white matter hyperintensity is nonspecific but may be related to  chronic microvascular ischemia. No features characteristic of demyelinating disease.   CTA NECK FINDINGS5/03/2020 Aortic arch: Examination degraded by motion artifact and timing of the contrast bolus. Visualized aortic arch of normal caliber with normal 3 vesselmorphology. No flow-limiting stenosis seen about the origin of thegreat vessels. Visualized subclavian arteries widely patent. Right carotid system: Right common and internal carotid arteriesgrossly patent without stenosis, dissection, or occlusion. Evaluation limited by motion. Left carotid system: Left common and internal carotid arteriesgrossly patent without stenosis, dissection or occlusion. Evaluationlimited by motion. Vertebral arteries: Both vertebral arteries arise from thesubclavian arteries. Left vertebral artery dominant. Vertebral arteries patent without stenosis, dissection or occlusion. Evaluation limited by motion. Skeleton: No acute osseous abnormality. No visible discrete osseouslesions. Other neck: No other acute soft tissue abnormality within the neck. Upper chest: Visualized upper chest demonstrates no acute finding. Review of the MIP images confirms the above findings  CTA HEAD FINDINGS  Anterior circulation: Examination markedly degraded by motion and timing of the contrast bolus.  Internal carotid arteries grossly patent to the termini without appreciable stenosis or other abnormality. A1 segments patent. Normal anterior communicating artery complex anterior cerebralarteries grossly patent to their distal aspects without stenosis. No M1 stenosis or occlusion. Normal MCA bifurcations. Distal MCA branches well perfused and symmetric.  Posterior circulation: Vertebral arteries patent to the vertebrobasilar junction without stenosis. Both picas patent.Basilar patent to its distal aspect without stenosis. Superior cerebral arteries patent bilaterally. Both PCAs primarily supplied via the basilar  well perfused to their distal aspects. Venous sinuses: Patent.Anatomic variants: None significant.Review of the MIP images confirms the above findings IMPRESSION: 1. Technically limited exam due to motion artifact and timing the contrast bolus. 2. Grossly negative CTA of the neck. No large vessel occlusion,  hemodynamically significant stenosis, or other acute vascular abnormality.  Recent Labs: No results found for requested labs within last 8760 hours.  Recent Lipid Panel No results found for: CHOL, TRIG, HDL, CHOLHDL, VLDL, LDLCALC, LDLDIRECT  Physical Exam:    VS:  BP 138/78   Pulse 100   Ht 5\' 6"  (1.676 m)   Wt 156 lb 6.4 oz (70.9 kg)   SpO2 99%   BMI 25.24 kg/m     Wt Readings from Last 3 Encounters:  05/20/20 156 lb 6.4 oz (70.9 kg)  12/13/19 164 lb (74.4 kg)  11/13/19 171 lb (77.6 kg)     GEN: Well nourished, well developed in no acute distress HEENT: Normal NECK: No JVD; No carotid bruits LYMPHATICS: No lymphadenopathy CARDIAC: S1S2 noted,RRR, no murmurs, rubs, gallops RESPIRATORY:  Clear to auscultation without rales, wheezing or rhonchi  ABDOMEN: Soft, non-tender, non-distended, +bowel sounds, no guarding. EXTREMITIES: No  edema, No cyanosis, no clubbing MUSCULOSKELETAL:  No deformity  SKIN: Warm and dry NEUROLOGIC:  Alert and oriented x 3, non-focal PSYCHIATRIC:  Normal affect, good insight  ASSESSMENT:    1. Soft tissue mass   2. Primary hypertension    PLAN:     There is evidence of a soft tissue mass in her left chest wall.  We will get a get a CT scan without contrast to be able to characterize this. She had not been taking the amlodipine as she tells me her blood pressure has been on the lower side.  She is going to monitor her blood pressure daily. She is planning to follow-up with her neurologist and PCP hopefully for titrating off Topamax.  The patient is in agreement with the above plan. The patient left the office in stable condition.  The  patient will follow up in 1 year or sooner if needed.   Medication Adjustments/Labs and Tests Ordered: Current medicines are reviewed at length with the patient today.  Concerns regarding medicines are outlined above.  Orders Placed This Encounter  Procedures  . CT Chest Wo Contrast   No orders of the defined types were placed in this encounter.   Patient Instructions  Medication Instructions:  Your physician recommends that you continue on your current medications as directed. Please refer to the Current Medication list given to you today.  *If you need a refill on your cardiac medications before your next appointment, please call your pharmacy*   Lab Work: NONE If you have labs (blood work) drawn today and your tests are completely normal, you will receive your results only by: Marland Kitchen MyChart Message (if you have MyChart) OR . A paper copy in the mail If you have any lab test that is abnormal or we need to change your treatment, we will call you to review the results.   Testing/Procedures: A chest x-ray takes a picture of the organs and structures inside the chest, including the heart, lungs, and blood vessels. This test can show several things, including, whether the heart is enlarges; whether fluid is building up in the lungs; and whether pacemaker / defibrillator leads are still in place.    Follow-Up: At [facility], you and your health needs are our priority.  As part of our continuing mission to provide you with exceptional heart care, we have created designated Provider Care Teams.  These Care Teams include your primary Cardiologist (physician) and Advanced Practice Providers (APPs -  Physician Assistants and Nurse Practitioners) who all work together to provide you with the care you need, when you need it.  We recommend signing up for the patient portal called "MyChart".  Sign up information is provided on this After Visit Summary.  MyChart is used to connect with patients  for Virtual Visits (Telemedicine).  Patients are able to view lab/test results, encounter notes, upcoming appointments, etc.  Non-urgent messages can be sent to your provider as well.   To learn more about what you can do with MyChart, go to [email or web address].    Your next appointment:   12 month(s)  The format for your next appointment:   In Person  Provider:   Berniece Salines, DO   Other Instructions      Adopting a Healthy Lifestyle.  Know what a healthy weight is for you (roughly BMI <25) and aim to maintain this   Aim for 7+ servings of fruits and vegetables daily   65-80+ fluid ounces of water or unsweet  tea for healthy kidneys   Limit to max 1 drink of alcohol per day; avoid smoking/tobacco   Limit animal fats in diet for cholesterol and heart health - choose grass fed whenever available   Avoid highly processed foods, and foods high in saturated/trans fats   Aim for low stress - take time to unwind and care for your mental health   Aim for 150 min of moderate intensity exercise weekly for heart health, and weights twice weekly for bone health   Aim for 7-9 hours of sleep daily   When it comes to diets, agreement about the perfect plan isnt easy to find, even among the experts. Experts at the [employer] developed an idea known as the Healthy Eating Plate. Just imagine a plate divided into logical, healthy portions.   The emphasis is on diet quality:   Load up on vegetables and fruits - one-half of your plate: Aim for color and variety, and remember that potatoes dont count.   Go for whole grains - one-quarter of your plate: Whole wheat, barley, wheat berries, quinoa, oats, brown rice, and foods made with them. If you want pasta, go with whole wheat pasta.   Protein power - one-quarter of your plate: Fish, chicken, beans, and nuts are all healthy, versatile protein sources. Limit red meat.   The diet, however, does go beyond the plate,  offering a few other suggestions.   Use healthy plant oils, such as olive, canola, soy, corn, sunflower and peanut. Check the labels, and avoid partially hydrogenated oil, which have unhealthy trans fats.   If youre thirsty, drink water. Coffee and tea are good in moderation, but skip sugary drinks and limit milk and dairy products to one or two daily servings.   The type of carbohydrate in the diet is more important than the amount. Some sources of carbohydrates, such as vegetables, fruits, whole grains, and beans-are healthier than others.   Finally, stay active  Signed, Berniece Salines, DO  05/20/2020 4:49 PM     [facility]

## 2020-05-20 NOTE — Patient Instructions (Signed)
Medication Instructions:  Your physician recommends that you continue on your current medications as directed. Please refer to the Current Medication list given to you today.  *If you need a refill on your cardiac medications before your next appointment, please call your pharmacy*   Lab Work: NONE If you have labs (blood work) drawn today and your tests are completely normal, you will receive your results only by: Marland Kitchen MyChart Message (if you have MyChart) OR . A paper copy in the mail If you have any lab test that is abnormal or we need to change your treatment, we will call you to review the results.   Testing/Procedures: A chest x-ray takes a picture of the organs and structures inside the chest, including the heart, lungs, and blood vessels. This test can show several things, including, whether the heart is enlarges; whether fluid is building up in the lungs; and whether pacemaker / defibrillator leads are still in place.    Follow-Up: At [facility], you and your health needs are our priority.  As part of our continuing mission to provide you with exceptional heart care, we have created designated Provider Care Teams.  These Care Teams include your primary Cardiologist (physician) and Advanced Practice Providers (APPs -  Physician Assistants and Nurse Practitioners) who all work together to provide you with the care you need, when you need it.  We recommend signing up for the patient portal called "MyChart".  Sign up information is provided on this After Visit Summary.  MyChart is used to connect with patients for Virtual Visits (Telemedicine).  Patients are able to view lab/test results, encounter notes, upcoming appointments, etc.  Non-urgent messages can be sent to your provider as well.   To learn more about what you can do with MyChart, go to [email or web address].    Your next appointment:   12 month(s)  The format for your next appointment:   In Person  Provider:    Berniece Salines, DO   Other Instructions

## 2020-11-09 ENCOUNTER — Other Ambulatory Visit: Payer: Self-pay | Admitting: Cardiology

## 2020-11-09 NOTE — Telephone Encounter (Signed)
Refill sent to pharmacy.   

## 2021-05-10 ENCOUNTER — Other Ambulatory Visit: Payer: Self-pay | Admitting: Cardiology

## 2021-05-25 ENCOUNTER — Ambulatory Visit: Payer: BC Managed Care – PPO | Admitting: Cardiology

## 2021-06-14 ENCOUNTER — Other Ambulatory Visit: Payer: Self-pay

## 2021-06-14 ENCOUNTER — Encounter: Payer: Self-pay | Admitting: Cardiology

## 2021-06-14 ENCOUNTER — Ambulatory Visit: Payer: BC Managed Care – PPO | Admitting: Cardiology

## 2021-06-14 VITALS — BP 136/92 | HR 81 | Ht 66.0 in | Wt 157.8 lb

## 2021-06-14 DIAGNOSIS — Z719 Counseling, unspecified: Secondary | ICD-10-CM | POA: Diagnosis not present

## 2021-06-14 DIAGNOSIS — I10 Essential (primary) hypertension: Secondary | ICD-10-CM | POA: Diagnosis not present

## 2021-06-14 DIAGNOSIS — E663 Overweight: Secondary | ICD-10-CM

## 2021-06-14 MED ORDER — AMLODIPINE BESYLATE 2.5 MG PO TABS: 2.5000 mg | ORAL_TABLET | Freq: Every day | ORAL | 3 refills | Status: DC

## 2021-06-14 NOTE — Patient Instructions (Signed)
Medication Instructions:  Your physician has recommended you make the following change in your medication:  DECREASE: Amlodipine 2.5 mg once daily *If you need a refill on your cardiac medications before your next appointment, please call your pharmacy*   Lab Work: None If you have labs (blood work) drawn today and your tests are completely normal, you will receive your results only by: Patrick (if you have MyChart) OR A paper copy in the mail If you have any lab test that is abnormal or we need to change your treatment, we will call you to review the results.   Testing/Procedures: None   Follow-Up: At [facility], you and your health needs are our priority.  As part of our continuing mission to provide you with exceptional heart care, we have created designated Provider Care Teams.  These Care Teams include your primary Cardiologist (physician) and Advanced Practice Providers (APPs -  Physician Assistants and Nurse Practitioners) who all work together to provide you with the care you need, when you need it.  We recommend signing up for the patient portal called "MyChart".  Sign up information is provided on this After Visit Summary.  MyChart is used to connect with patients for Virtual Visits (Telemedicine).  Patients are able to view lab/test results, encounter notes, upcoming appointments, etc.  Non-urgent messages can be sent to your provider as well.   To learn more about what you can do with MyChart, go to [email or web address].    Your next appointment:   1 year(s)  The format for your next appointment:   In Person  Provider:   Berniece Salines, DO     Other Instructions

## 2021-06-14 NOTE — Progress Notes (Signed)
Cardiology Office Note:    Date:  06/17/2021   ID:  Diane Evans, DOB [birth year], MRN [id number]  PCP:  Myrlene Broker, MD  Cardiologist:  Berniece Salines, DO  Electrophysiologist:  None   Referring MD: Myrlene Broker, MD   " I am doing a lot better"  History of Present Illness:    Diane Evans is a 45 y.o. female with a hx of hypertension, hypothyroidism, migraine headaches, recent concern for TIA which was admitted to [facility] in May 2012 where her echo showed evidence of positive bubble study noting a PFO.   I did see the patient on Nov 13, 2019 at the time she presented to establish cardiac care.  She was hypertensive I started her on amlodipine 5 mg daily.  Also started patient on aspirin and Lipitor 20 mg daily.  We discussed at that time that it would be beneficial for her to pursue medical management for her migraine headaches and if this fails we will pursue investigating her PFO for possible closure.  She preferred not to pursue any TEE.   At her last visit in June 2021 we continue her amlodipine aspirin as well as her statin.  We did again speak about her PFO bubble study and the patient preferred to not pursue any TEE.  I saw the patient on May 20, 2020 at that time she had been started on medications for her migraines.  At that visit she had not been taking her amlodipine therefore I told her to take her blood pressure daily.  She is here today she tells me she is doing well.  She still is not taking her amlodipine.  She is excited she is back in school and she is going for nursing.  During the visit she had her wife on the phone who does not report any complaints.   Past Medical History:  Diagnosis Date   Acute non-recurrent sinusitis 01/16/2019   Bronchitis 05/04/2020   Constipation 04/17/2020   COVID-19 04/17/2020   Encounter for annual physical examination excluding gynecological examination in a patient older than 17 years 04/17/2017   Headache     History of TIA (transient ischemic attack)    Hospital discharge follow-up 11/18/2019   Hypokalemia 11/18/2019   Hypothyroidism (acquired) 04/17/2017   Lumbar radicular pain 06/14/2017   Migraine without status migrainosus, not intractable 09/14/2017   Nausea 05/04/2020   Polymenorrhea 06/13/2019   Reactive thrombocytosis 11/18/2019   Sore throat 10/25/2017   Spondylosis of lumbar spine 06/14/2017   Tendinitis of left rotator cuff 11/03/2017    Past Surgical History:  Procedure Laterality Date   CARDIAC CATHETERIZATION  2017   No stents put in    Current Medications: Current Meds  Medication Sig   albuterol (VENTOLIN HFA) 108 (90 Base) MCG/ACT inhaler Inhale into the lungs.   amLODipine (NORVASC) 2.5 MG tablet Take 1 tablet (2.5 mg total) by mouth daily.   ASPIRIN 81 81 MG chewable tablet Chew 1 tablet (81 mg total) by mouth daily.   atorvastatin (LIPITOR) 20 MG tablet Take 1 tablet by mouth once daily   azithromycin (ZITHROMAX) 500 MG tablet Take 500 mg by mouth daily.   budesonide-formoterol (SYMBICORT) 160-4.5 MCG/ACT inhaler Inhale into the lungs.   cyclobenzaprine (FLEXERIL) 10 MG tablet Take 10 mg by mouth 3 (three) times daily.   levothyroxine (SYNTHROID) 75 MCG tablet Take by mouth daily.   ondansetron (ZOFRAN-ODT) 4 MG disintegrating tablet Take 4 mg by mouth every 8 (eight) hours  as needed.   rizatriptan (MAXALT) 5 MG tablet Take by mouth daily.   traMADol (ULTRAM) 50 MG tablet Take by mouth as needed.     Allergies:   Patient has no known allergies.   Social History   Socioeconomic History   Marital status: Unknown    Spouse name: Not on file   Number of children: Not on file   Years of education: Not on file   Highest education level: Not on file  Occupational History   Not on file  Tobacco Use   Smoking status: Former    Types: Cigarettes    Quit date: 11/13/1999    Years since quitting: 21.6   Smokeless tobacco: Never  Substance and Sexual Activity    Alcohol use: Not on file    Comment: Occasional   Drug use: Never   Sexual activity: Not on file  Other Topics Concern   Not on file  Social History Narrative   Not on file   Social Determinants of Health   Financial Resource Strain: Not on file  Food Insecurity: Not on file  Transportation Needs: Not on file  Physical Activity: Not on file  Stress: Not on file  Social Connections: Not on file     Family History: The patient's family history includes Heart attack in her maternal grandfather; Heart murmur in her mother.  ROS:   Review of Systems  Constitution: Negative for decreased appetite, fever and weight gain.  HENT: Negative for congestion, ear discharge, hoarse voice and sore throat.   Eyes: Negative for discharge, redness, vision loss in right eye and visual halos.  Cardiovascular: Negative for chest pain, dyspnea on exertion, leg swelling, orthopnea and palpitations.  Respiratory: Negative for cough, hemoptysis, shortness of breath and snoring.   Endocrine: Negative for heat intolerance and polyphagia.  Hematologic/Lymphatic: Negative for bleeding problem. Does not bruise/bleed easily.  Skin: Negative for flushing, nail changes, rash and suspicious lesions.  Musculoskeletal: Negative for arthritis, joint pain, muscle cramps, myalgias, neck pain and stiffness.  Gastrointestinal: Negative for abdominal pain, bowel incontinence, diarrhea and excessive appetite.  Genitourinary: Negative for decreased libido, genital sores and incomplete emptying.  Neurological: Negative for brief paralysis, focal weakness, headaches and loss of balance.  Psychiatric/Behavioral: Negative for altered mental status, depression and suicidal ideas.  Allergic/Immunologic: Negative for HIV exposure and persistent infections.    EKGs/Labs/Other Studies Reviewed:    The following studies were reviewed today:   EKG:  The ekg ordered today demonstrates sinus rhythm, heart rate 81 bpm.  TTE  Impression Normal lv chamber size and wall thickness. LV EF 55-60%. Grade I Diastolic dysfunction. LA moderately dilated. PFO noted with positive bubble study.   MRI HEAD WITHOUT CONTRAST 11/1019/2 TECHNIQUE: Multiplanar, multiecho pulse sequences of the brain and surrounding structures were obtained without intravenous contrast. COMPARISON:  CT angio head neck 11/10/2019 FINDINGS: Brain: No acute infarction, hemorrhage, hydrocephalus, extra-axial collection or mass lesion. 3 mm hyperintensity left frontal deep white matter. Otherwise normal white matter. Normal brainstem.. Vascular: Normal arterial flow voids Skull and upper cervical spine: No focal skeletal lesion. Sinuses/Orbits: Negative Other: None IMPRESSION: No acute abnormality. Solitary 3 mm left frontal white matter hyperintensity is nonspecific but may be related to chronic microvascular ischemia. No features characteristic of demyelinating disease.     CTA NECK FINDINGS 11/10/2019 Aortic arch: Examination degraded by motion artifact and timing of the contrast bolus. Visualized aortic arch of normal caliber with normal 3 vessel morphology. No flow-limiting stenosis seen about the  origin of the great vessels. Visualized subclavian arteries widely patent. Right carotid system: Right common and internal carotid arteries grossly patent without stenosis, dissection, or occlusion. Evaluation limited by motion. Left carotid system: Left common and internal carotid arteries grossly patent without stenosis, dissection or occlusion. Evaluation limited by motion. Vertebral arteries: Both vertebral arteries arise from the subclavian arteries. Left vertebral artery dominant. Vertebral arteries patent without stenosis, dissection or occlusion. Evaluation limited by motion. Skeleton: No acute osseous abnormality. No visible discrete osseous lesions. Other neck: No other acute soft tissue abnormality within the neck. Upper chest:  Visualized upper chest demonstrates no acute finding. Review of the MIP images confirms the above findings   CTA HEAD FINDINGS   Anterior circulation: Examination markedly degraded by motion and timing of the contrast bolus.   Internal carotid arteries grossly patent to the termini without appreciable stenosis or other abnormality. A1 segments patent. Normal anterior communicating artery complex anterior cerebralarteries grossly patent to their distal aspects without stenosis. No M1 stenosis or occlusion. Normal MCA bifurcations. Distal MCA branches well perfused and symmetric.   Posterior circulation: Vertebral arteries patent to the vertebrobasilar junction without stenosis. Both picas patent.Basilar patent to its distal aspect without stenosis. Superior cerebral arteries patent bilaterally. Both PCAs primarily supplied via the basilar well perfused to their distal aspects. Venous sinuses: Patent.Anatomic variants: None significant.Review of the MIP images confirms the above findings IMPRESSION: 1. Technically limited exam due to motion artifact and timing the contrast bolus. 2. Grossly negative CTA of the neck. No large vessel occlusion,   hemodynamically significant stenosis, or other acute vascular abnormality.  Recent Labs: No results found for requested labs within last 8760 hours.  Recent Lipid Panel No results found for: CHOL, TRIG, HDL, CHOLHDL, VLDL, LDLCALC, LDLDIRECT  Physical Exam:    VS:  BP (!) 136/92   Pulse 81   Ht 5\' 6"  (1.676 m)   Wt 157 lb 12.8 oz (71.6 kg)   LMP  (LMP Unknown)   SpO2 99%   BMI 25.47 kg/m     Wt Readings from Last 3 Encounters:  06/14/21 157 lb 12.8 oz (71.6 kg)  05/20/20 156 lb 6.4 oz (70.9 kg)  12/13/19 164 lb (74.4 kg)     GEN: Well nourished, well developed in no acute distress HEENT: Normal NECK: No JVD; No carotid bruits LYMPHATICS: No lymphadenopathy CARDIAC: S1S2 noted,RRR, no murmurs, rubs, gallops RESPIRATORY:  Clear to  auscultation without rales, wheezing or rhonchi  ABDOMEN: Soft, non-tender, non-distended, +bowel sounds, no guarding. EXTREMITIES: No edema, No cyanosis, no clubbing MUSCULOSKELETAL:  No deformity  SKIN: Warm and dry NEUROLOGIC:  Alert and oriented x 3, non-focal PSYCHIATRIC:  Normal affect, good insight  ASSESSMENT:    1. Hypertension, unspecified type   2. Health education/counseling   3. Overweight    PLAN:     Blood pressure is elevated in the office today.  I have talked to the patient she is to take amlodipine 5 mg today and she think that it dropped her blood pressure significantly.  So I have asked her to cut this down to 2.5 mg so she get at least have her blood pressure control of less than 130/80 mmHg.  She is agreeable for this.  No recurrent syncope or palpitations.  The patient understands the need to lose weight with diet and exercise. We have discussed specific strategies for this.   The patient is in agreement with the above plan. The patient left the office in stable condition.  The patient will follow up in   Medication Adjustments/Labs and Tests Ordered: Current medicines are reviewed at length with the patient today.  Concerns regarding medicines are outlined above.  Orders Placed This Encounter  Procedures   EKG 12-Lead   Meds ordered this encounter  Medications   amLODipine (NORVASC) 2.5 MG tablet    Sig: Take 1 tablet (2.5 mg total) by mouth daily.    Dispense:  180 tablet    Refill:  3    Patient Instructions  Medication Instructions:  Your physician has recommended you make the following change in your medication:  DECREASE: Amlodipine 2.5 mg once daily *If you need a refill on your cardiac medications before your next appointment, please call your pharmacy*   Lab Work: None If you have labs (blood work) drawn today and your tests are completely normal, you will receive your results only by: [place] (if you have MyChart) OR A  paper copy in the mail If you have any lab test that is abnormal or we need to change your treatment, we will call you to review the results.   Testing/Procedures: None   Follow-Up: At [facility], you and your health needs are our priority.  As part of our continuing mission to provide you with exceptional heart care, we have created designated Provider Care Teams.  These Care Teams include your primary Cardiologist (physician) and Advanced Practice Providers (APPs -  Physician Assistants and Nurse Practitioners) who all work together to provide you with the care you need, when you need it.  We recommend signing up for the patient portal called "MyChart".  Sign up information is provided on this After Visit Summary.  MyChart is used to connect with patients for Virtual Visits (Telemedicine).  Patients are able to view lab/test results, encounter notes, upcoming appointments, etc.  Non-urgent messages can be sent to your provider as well.   To learn more about what you can do with MyChart, go to [email or web address].    Your next appointment:   1 year(s)  The format for your next appointment:   In Person  Provider:   Berniece Salines, DO     Other Instructions     Adopting a Healthy Lifestyle.  Know what a healthy weight is for you (roughly BMI <25) and aim to maintain this   Aim for 7+ servings of fruits and vegetables daily   65-80+ fluid ounces of water or unsweet tea for healthy kidneys   Limit to max 1 drink of alcohol per day; avoid smoking/tobacco   Limit animal fats in diet for cholesterol and heart health - choose grass fed whenever available   Avoid highly processed foods, and foods high in saturated/trans fats   Aim for low stress - take time to unwind and care for your mental health   Aim for 150 min of moderate intensity exercise weekly for heart health, and weights twice weekly for bone health   Aim for 7-9 hours of sleep daily   When it comes to diets,  agreement about the perfect plan isnt easy to find, even among the experts. Experts at the [employer] developed an idea known as the Healthy Eating Plate. Just imagine a plate divided into logical, healthy portions.   The emphasis is on diet quality:   Load up on vegetables and fruits - one-half of your plate: Aim for color and variety, and remember that potatoes dont count.   Go for whole grains - one-quarter  of your plate: Whole wheat, barley, wheat berries, quinoa, oats, brown rice, and foods made with them. If you want pasta, go with whole wheat pasta.   Protein power - one-quarter of your plate: Fish, chicken, beans, and nuts are all healthy, versatile protein sources. Limit red meat.   The diet, however, does go beyond the plate, offering a few other suggestions.   Use healthy plant oils, such as olive, canola, soy, corn, sunflower and peanut. Check the labels, and avoid partially hydrogenated oil, which have unhealthy trans fats.   If youre thirsty, drink water. Coffee and tea are good in moderation, but skip sugary drinks and limit milk and dairy products to one or two daily servings.   The type of carbohydrate in the diet is more important than the amount. Some sources of carbohydrates, such as vegetables, fruits, whole grains, and beans-are healthier than others.   Finally, stay active  Signed, Berniece Salines, DO  06/17/2021 8:01 PM    [facility]

## 2021-06-17 ENCOUNTER — Ambulatory Visit: Payer: BC Managed Care – PPO | Admitting: Cardiology

## 2021-06-17 DIAGNOSIS — I10 Essential (primary) hypertension: Secondary | ICD-10-CM | POA: Insufficient documentation

## 2021-06-17 DIAGNOSIS — Z719 Counseling, unspecified: Secondary | ICD-10-CM | POA: Insufficient documentation

## 2021-06-17 DIAGNOSIS — E663 Overweight: Secondary | ICD-10-CM | POA: Insufficient documentation

## 2021-06-21 ENCOUNTER — Ambulatory Visit: Payer: BC Managed Care – PPO | Admitting: Cardiology

## 2021-08-05 ENCOUNTER — Other Ambulatory Visit: Payer: Self-pay | Admitting: Orthopaedic Surgery

## 2021-08-05 DIAGNOSIS — M47816 Spondylosis without myelopathy or radiculopathy, lumbar region: Secondary | ICD-10-CM

## 2021-08-09 ENCOUNTER — Other Ambulatory Visit: Payer: Self-pay | Admitting: Cardiology

## 2021-08-26 ENCOUNTER — Other Ambulatory Visit: Payer: Self-pay

## 2021-08-26 ENCOUNTER — Ambulatory Visit
Admission: RE | Admit: 2021-08-26 | Discharge: 2021-08-26 | Disposition: A | Discharge status: Home / Self Care | Payer: BC Managed Care – PPO | Source: Ambulatory Visit | Attending: Orthopaedic Surgery | Admitting: Orthopaedic Surgery

## 2021-08-26 DIAGNOSIS — M47816 Spondylosis without myelopathy or radiculopathy, lumbar region: Secondary | ICD-10-CM

## 2021-11-07 ENCOUNTER — Other Ambulatory Visit: Payer: Self-pay | Admitting: Cardiology

## 2021-11-16 ENCOUNTER — Other Ambulatory Visit: Payer: Self-pay | Admitting: Family Medicine

## 2021-11-16 DIAGNOSIS — N6489 Other specified disorders of breast: Secondary | ICD-10-CM

## 2021-11-16 DIAGNOSIS — R921 Mammographic calcification found on diagnostic imaging of breast: Secondary | ICD-10-CM

## 2021-11-24 ENCOUNTER — Ambulatory Visit
Admission: RE | Admit: 2021-11-24 | Discharge: 2021-11-24 | Disposition: A | Discharge status: Home / Self Care | Payer: BC Managed Care – PPO | Source: Ambulatory Visit | Attending: Family Medicine | Admitting: Family Medicine

## 2021-11-24 DIAGNOSIS — N6489 Other specified disorders of breast: Secondary | ICD-10-CM

## 2021-11-24 DIAGNOSIS — R921 Mammographic calcification found on diagnostic imaging of breast: Secondary | ICD-10-CM

## 2022-06-23 ENCOUNTER — Ambulatory Visit: Payer: BC Managed Care – PPO | Attending: Cardiology | Admitting: Cardiology

## 2022-06-23 ENCOUNTER — Encounter: Payer: Self-pay | Admitting: Cardiology

## 2022-06-23 VITALS — BP 122/82 | HR 82 | Ht 66.0 in | Wt 169.8 lb

## 2022-06-23 DIAGNOSIS — I10 Essential (primary) hypertension: Secondary | ICD-10-CM

## 2022-06-23 NOTE — Patient Instructions (Addendum)
Medication Instructions:  Your physician has recommended you make the following change in your medication:  STOP: Amlodipine  *If you need a refill on your cardiac medications before your next appointment, please call your pharmacy*   Lab Work: NONE If you have labs (blood work) drawn today and your tests are completely normal, you will receive your results only by: [place] (if you have MyChart) OR A paper copy in the mail If you have any lab test that is abnormal or we need to change your treatment, we will call you to review the results.   Testing/Procedures: NONE   Follow-Up: At [facility], you and your health needs are our priority.  As part of our continuing mission to provide you with exceptional heart care, we have created designated Provider Care Teams.  These Care Teams include your primary Cardiologist (physician) and Advanced Practice Providers (APPs -  Physician Assistants and Nurse Practitioners) who all work together to provide you with the care you need, when you need it.  We recommend signing up for the patient portal called "MyChart".  Sign up information is provided on this After Visit Summary.  MyChart is used to connect with patients for Virtual Visits (Telemedicine).  Patients are able to view lab/test results, encounter notes, upcoming appointments, etc.  Non-urgent messages can be sent to your provider as well.   To learn more about what you can do with MyChart, go to [email or web address].    Your next appointment:   1 year(s)  The format for your next appointment:   In Person  Provider:   Berniece Salines, DO

## 2022-06-23 NOTE — Progress Notes (Signed)
Cardiology Office Note:    Date:  06/25/2022   ID:  Diane Evans, DOB 09-27-1975, MRN [id number]  PCP:  Myrlene Broker, MD  Cardiologist:  Berniece Salines, DO  Electrophysiologist:  None   Referring MD: Myrlene Broker, MD   " I am doing a lot better"  History of Present Illness:    Diane Evans is a 46 y.o. female with a hx of hypertension, hypothyroidism, migraine headaches, recent concern for TIA which was admitted to [facility] in May 2012 where her echo showed evidence of positive bubble study noting a PFO.   I did see the patient on Nov 13, 2019 at the time she presented to establish cardiac care.  She was hypertensive I started her on amlodipine 5 mg daily.  Also started patient on aspirin and Lipitor 20 mg daily.  We discussed at that time that it would be beneficial for her to pursue medical management for her migraine headaches and if this fails we will pursue investigating her PFO for possible closure.  She preferred not to pursue any TEE.   At her last visit in June 2021 we continue her amlodipine aspirin as well as her statin.  We did again speak about her PFO bubble study and the patient preferred to not pursue any TEE.  I saw the patient on May 20, 2020 at that time she had been started on medications for her migraines.  At that visit she had not been taking her amlodipine therefore I told her to take her blood pressure daily.  She is here today she tells me she is doing well.  She still is not taking her amlodipine.  During the visit she had her wife on the phone who does not report any complaints.  She shared with me home blood pressure which is mostly in the low 423N systolics.   Past Medical History:  Diagnosis Date   Acute non-recurrent sinusitis 01/16/2019   Bronchitis 05/04/2020   Constipation 04/17/2020   COVID-19 04/17/2020   Encounter for annual physical examination excluding gynecological examination in a patient older than 17 years 04/17/2017    Headache    History of TIA (transient ischemic attack)    Hospital discharge follow-up 11/18/2019   Hypokalemia 11/18/2019   Hypothyroidism (acquired) 04/17/2017   Lumbar radicular pain 06/14/2017   Migraine without status migrainosus, not intractable 09/14/2017   Nausea 05/04/2020   Polymenorrhea 06/13/2019   Reactive thrombocytosis 11/18/2019   Sore throat 10/25/2017   Spondylosis of lumbar spine 06/14/2017   Tendinitis of left rotator cuff 11/03/2017    Past Surgical History:  Procedure Laterality Date   CARDIAC CATHETERIZATION  2017   No stents put in    Current Medications: Current Meds  Medication Sig   albuterol (VENTOLIN HFA) 108 (90 Base) MCG/ACT inhaler Inhale into the lungs.   ASPIRIN 81 81 MG chewable tablet Chew 1 tablet (81 mg total) by mouth daily.   atorvastatin (LIPITOR) 20 MG tablet Take 1 tablet by mouth once daily   cyclobenzaprine (FLEXERIL) 10 MG tablet Take 10 mg by mouth 3 (three) times daily.   diazepam (VALIUM) 5 MG tablet Take 5 mg by mouth as needed.   fluticasone (FLONASE) 50 MCG/ACT nasal spray Place into the nose.   levothyroxine (SYNTHROID) 75 MCG tablet Take by mouth daily.   meloxicam (MOBIC) 7.5 MG tablet Take 7.5-15 mg by mouth daily.   montelukast (SINGULAIR) 10 MG tablet Take 10 mg by mouth at bedtime.   omeprazole (  PRILOSEC) 20 MG capsule TAKE 1 CAPSULE BY MOUTH IN THE MORNING AND AT BEDTIME   pregabalin (LYRICA) 150 MG capsule Take 150 mg by mouth 2 (two) times daily.   rizatriptan (MAXALT) 5 MG tablet Take by mouth daily.     Allergies:   Patient has no known allergies.   Social History   Socioeconomic History   Marital status: Married    Spouse name: Not on file   Number of children: Not on file   Years of education: Not on file   Highest education level: Not on file  Occupational History   Not on file  Tobacco Use   Smoking status: Former    Types: Cigarettes    Quit date: 11/13/1999    Years since quitting: 22.6   Smokeless  tobacco: Never  Substance and Sexual Activity   Alcohol use: Not on file    Comment: Occasional   Drug use: Never   Sexual activity: Not on file  Other Topics Concern   Not on file  Social History Narrative   Not on file   Social Determinants of Health   Financial Resource Strain: Not on file  Food Insecurity: Not on file  Transportation Needs: Not on file  Physical Activity: Not on file  Stress: Not on file  Social Connections: Not on file     Family History: The patient's family history includes Heart attack in her maternal grandfather; Heart murmur in her mother.  ROS:   Review of Systems  Constitution: Negative for decreased appetite, fever and weight gain.  HENT: Negative for congestion, ear discharge, hoarse voice and sore throat.   Eyes: Negative for discharge, redness, vision loss in right eye and visual halos.  Cardiovascular: Negative for chest pain, dyspnea on exertion, leg swelling, orthopnea and palpitations.  Respiratory: Negative for cough, hemoptysis, shortness of breath and snoring.   Endocrine: Negative for heat intolerance and polyphagia.  Hematologic/Lymphatic: Negative for bleeding problem. Does not bruise/bleed easily.  Skin: Negative for flushing, nail changes, rash and suspicious lesions.  Musculoskeletal: Negative for arthritis, joint pain, muscle cramps, myalgias, neck pain and stiffness.  Gastrointestinal: Negative for abdominal pain, bowel incontinence, diarrhea and excessive appetite.  Genitourinary: Negative for decreased libido, genital sores and incomplete emptying.  Neurological: Negative for brief paralysis, focal weakness, headaches and loss of balance.  Psychiatric/Behavioral: Negative for altered mental status, depression and suicidal ideas.  Allergic/Immunologic: Negative for HIV exposure and persistent infections.    EKGs/Labs/Other Studies Reviewed:    The following studies were reviewed today:   EKG:  The ekg ordered today  demonstrates sinus rhythm, heart rate 81 bpm.  TTE Impression Normal lv chamber size and wall thickness. LV EF 55-60%. Grade I Diastolic dysfunction. LA moderately dilated. PFO noted with positive bubble study.   MRI HEAD WITHOUT CONTRAST 11/1019/2 TECHNIQUE: Multiplanar, multiecho pulse sequences of the brain and surrounding structures were obtained without intravenous contrast. COMPARISON:  CT angio head neck 11/10/2019 FINDINGS: Brain: No acute infarction, hemorrhage, hydrocephalus, extra-axial collection or mass lesion. 3 mm hyperintensity left frontal deep white matter. Otherwise normal white matter. Normal brainstem.. Vascular: Normal arterial flow voids Skull and upper cervical spine: No focal skeletal lesion. Sinuses/Orbits: Negative Other: None IMPRESSION: No acute abnormality. Solitary 3 mm left frontal white matter hyperintensity is nonspecific but may be related to chronic microvascular ischemia. No features characteristic of demyelinating disease.     CTA NECK FINDINGS 11/10/2019 Aortic arch: Examination degraded by motion artifact and timing of the contrast bolus.  Visualized aortic arch of normal caliber with normal 3 vessel morphology. No flow-limiting stenosis seen about the origin of the great vessels. Visualized subclavian arteries widely patent. Right carotid system: Right common and internal carotid arteries grossly patent without stenosis, dissection, or occlusion. Evaluation limited by motion. Left carotid system: Left common and internal carotid arteries grossly patent without stenosis, dissection or occlusion. Evaluation limited by motion. Vertebral arteries: Both vertebral arteries arise from the subclavian arteries. Left vertebral artery dominant. Vertebral arteries patent without stenosis, dissection or occlusion. Evaluation limited by motion. Skeleton: No acute osseous abnormality. No visible discrete osseous lesions. Other neck: No other acute soft tissue  abnormality within the neck. Upper chest: Visualized upper chest demonstrates no acute finding. Review of the MIP images confirms the above findings   CTA HEAD FINDINGS   Anterior circulation: Examination markedly degraded by motion and timing of the contrast bolus.   Internal carotid arteries grossly patent to the termini without appreciable stenosis or other abnormality. A1 segments patent. Normal anterior communicating artery complex anterior cerebralarteries grossly patent to their distal aspects without stenosis. No M1 stenosis or occlusion. Normal MCA bifurcations. Distal MCA branches well perfused and symmetric.   Posterior circulation: Vertebral arteries patent to the vertebrobasilar junction without stenosis. Both picas patent.Basilar patent to its distal aspect without stenosis. Superior cerebral arteries patent bilaterally. Both PCAs primarily supplied via the basilar well perfused to their distal aspects. Venous sinuses: Patent.Anatomic variants: None significant.Review of the MIP images confirms the above findings IMPRESSION: 1. Technically limited exam due to motion artifact and timing the contrast bolus. 2. Grossly negative CTA of the neck. No large vessel occlusion,   hemodynamically significant stenosis, or other acute vascular abnormality.  Recent Labs: No results found for requested labs within last 365 days.  Recent Lipid Panel No results found for: "CHOL", "TRIG", "HDL", "CHOLHDL", "VLDL", "LDLCALC", "LDLDIRECT"  Physical Exam:    VS:  BP 122/82 (BP Location: Right Arm)   Pulse 82   Ht '5\' 6"'$  (1.676 m)   Wt 169 lb 12.8 oz (77 kg)   SpO2 98%   BMI 27.41 kg/m     Wt Readings from Last 3 Encounters:  06/23/22 169 lb 12.8 oz (77 kg)  06/14/21 157 lb 12.8 oz (71.6 kg)  05/20/20 156 lb 6.4 oz (70.9 kg)     GEN: Well nourished, well developed in no acute distress HEENT: Normal NECK: No JVD; No carotid bruits LYMPHATICS: No lymphadenopathy CARDIAC: S1S2  noted,RRR, no murmurs, rubs, gallops RESPIRATORY:  Clear to auscultation without rales, wheezing or rhonchi  ABDOMEN: Soft, non-tender, non-distended, +bowel sounds, no guarding. EXTREMITIES: No edema, No cyanosis, no clubbing MUSCULOSKELETAL:  No deformity  SKIN: Warm and dry NEUROLOGIC:  Alert and oriented x 3, non-focal PSYCHIATRIC:  Normal affect, good insight  ASSESSMENT:    1. Hypertension, unspecified type     PLAN:     She has not been taking amlodipine her blood pressure is on the lower side.  No need to continue this medication.  I will take this off her medication list.  No recurrent syncope or palpitations.  The patient understands the need to lose weight with diet and exercise. We have discussed specific strategies for this.   The patient is in agreement with the above plan. The patient left the office in stable condition.  The patient will follow up in   Medication Adjustments/Labs and Tests Ordered: Current medicines are reviewed at length with the patient today.  Concerns regarding medicines are  outlined above.  Orders Placed This Encounter  Procedures   EKG 12-Lead   No orders of the defined types were placed in this encounter.   Patient Instructions  Medication Instructions:  Your physician has recommended you make the following change in your medication:  STOP: Amlodipine  *If you need a refill on your cardiac medications before your next appointment, please call your pharmacy*   Lab Work: NONE If you have labs (blood work) drawn today and your tests are completely normal, you will receive your results only by: [place] (if you have MyChart) OR A paper copy in the mail If you have any lab test that is abnormal or we need to change your treatment, we will call you to review the results.   Testing/Procedures: NONE   Follow-Up: At [facility], you and your health needs are our priority.  As part of our continuing mission to  provide you with exceptional heart care, we have created designated Provider Care Teams.  These Care Teams include your primary Cardiologist (physician) and Advanced Practice Providers (APPs -  Physician Assistants and Nurse Practitioners) who all work together to provide you with the care you need, when you need it.  We recommend signing up for the patient portal called "MyChart".  Sign up information is provided on this After Visit Summary.  MyChart is used to connect with patients for Virtual Visits (Telemedicine).  Patients are able to view lab/test results, encounter notes, upcoming appointments, etc.  Non-urgent messages can be sent to your provider as well.   To learn more about what you can do with MyChart, go to [email or web address].    Your next appointment:   1 year(s)  The format for your next appointment:   In Person  Provider:   Berniece Salines, DO     Adopting a Healthy Lifestyle.  Know what a healthy weight is for you (roughly BMI <25) and aim to maintain this   Aim for 7+ servings of fruits and vegetables daily   65-80+ fluid ounces of water or unsweet tea for healthy kidneys   Limit to max 1 drink of alcohol per day; avoid smoking/tobacco   Limit animal fats in diet for cholesterol and heart health - choose grass fed whenever available   Avoid highly processed foods, and foods high in saturated/trans fats   Aim for low stress - take time to unwind and care for your mental health   Aim for 150 min of moderate intensity exercise weekly for heart health, and weights twice weekly for bone health   Aim for 7-9 hours of sleep daily   When it comes to diets, agreement about the perfect plan isnt easy to find, even among the experts. Experts at the [employer] developed an idea known as the Healthy Eating Plate. Just imagine a plate divided into logical, healthy portions.   The emphasis is on diet quality:   Load up on vegetables and fruits -  one-half of your plate: Aim for color and variety, and remember that potatoes dont count.   Go for whole grains - one-quarter of your plate: Whole wheat, barley, wheat berries, quinoa, oats, brown rice, and foods made with them. If you want pasta, go with whole wheat pasta.   Protein power - one-quarter of your plate: Fish, chicken, beans, and nuts are all healthy, versatile protein sources. Limit red meat.   The diet, however, does go beyond the plate, offering a few other suggestions.  Use healthy plant oils, such as olive, canola, soy, corn, sunflower and peanut. Check the labels, and avoid partially hydrogenated oil, which have unhealthy trans fats.   If youre thirsty, drink water. Coffee and tea are good in moderation, but skip sugary drinks and limit milk and dairy products to one or two daily servings.   The type of carbohydrate in the diet is more important than the amount. Some sources of carbohydrates, such as vegetables, fruits, whole grains, and beans-are healthier than others.   Finally, stay active  Signed, Berniece Salines, DO  06/25/2022 12:32 PM    [facility]

## 2022-08-09 ENCOUNTER — Other Ambulatory Visit: Payer: Self-pay | Admitting: Cardiology

## 2022-08-17 ENCOUNTER — Telehealth: Payer: Self-pay | Admitting: Cardiology

## 2022-08-17 NOTE — Telephone Encounter (Signed)
Pt c/o medication issue:  1. Name of Medication: amLODipine (NORVASC) 5 MG tablet   2. How are you currently taking this medication (dosage and times per day)?    3. Are you having a reaction (difficulty breathing--STAT)? no  4. What is your medication issue? Patient calling in to because she wants to start taking the medication again. Please advise

## 2022-08-17 NOTE — Telephone Encounter (Signed)
Called pt she states "I've been taking some Amlodipine but it is old. 114/74, 111/69, 123/70, 121/78." Pt states he pressure was higher but she started taking amlodipine a month ago and it's been better. Pt taking Amlodipine 2.5 mg once daily. Send medication to [facility] in [place] on [address].

## 2022-08-24 ENCOUNTER — Encounter: Payer: Self-pay | Admitting: Cardiology

## 2022-08-24 MED ORDER — AMLODIPINE BESYLATE 2.5 MG PO TABS: 2.5000 mg | ORAL_TABLET | Freq: Every day | ORAL | 3 refills | Status: DC

## 2022-10-25 ENCOUNTER — Other Ambulatory Visit: Payer: Self-pay | Admitting: Cardiology

## 2023-07-28 ENCOUNTER — Ambulatory Visit: Payer: BC Managed Care – PPO | Attending: Cardiology | Admitting: Cardiology

## 2023-07-28 ENCOUNTER — Encounter: Payer: Self-pay | Admitting: Cardiology

## 2023-07-28 VITALS — BP 138/88 | HR 62 | Ht 66.0 in | Wt 167.4 lb

## 2023-07-28 DIAGNOSIS — I10 Essential (primary) hypertension: Secondary | ICD-10-CM

## 2023-07-28 DIAGNOSIS — Z79899 Other long term (current) drug therapy: Secondary | ICD-10-CM

## 2023-07-28 MED ORDER — FUROSEMIDE 20 MG PO TABS: 20.0000 mg | ORAL_TABLET | ORAL | 3 refills | Status: DC

## 2023-07-28 MED ORDER — ATORVASTATIN CALCIUM 20 MG PO TABS: 20.0000 mg | ORAL_TABLET | Freq: Every day | ORAL | 3 refills | Status: DC

## 2023-07-28 MED ORDER — POTASSIUM CHLORIDE ER 10 MEQ PO TBCR: 10.0000 meq | EXTENDED_RELEASE_TABLET | ORAL | 3 refills | Status: DC

## 2023-07-28 MED ORDER — ASPIRIN 81 81 MG PO CHEW: 81.0000 mg | CHEWABLE_TABLET | Freq: Every day | ORAL | 3 refills | Status: AC

## 2023-07-28 MED ORDER — AMLODIPINE BESYLATE 2.5 MG PO TABS: 2.5000 mg | ORAL_TABLET | Freq: Every day | ORAL | 3 refills | Status: DC

## 2023-07-28 NOTE — Progress Notes (Signed)
Cardiology Office Note:    Date:  07/28/2023   ID:  Diane Evans, DOB Sep 05, 1975, MRN [id number]  PCP:  Diane Pen, MD  Cardiologist:  Thomasene Ripple, DO  Electrophysiologist:  None   Referring MD: Diane Pen, MD   " I am doing a lot better"  History of Present Illness:    Diane Evans is a 48 y.o. female with a hx of hypertension, hypothyroidism, migraine headaches, recent concern for TIA which was admitted to [facility] in May 2012 where her echo showed evidence of positive bubble study noting a PFO.   I did see the patient on Nov 13, 2019 at the time she presented to establish cardiac care.  She was hypertensive I started her on amlodipine 5 mg daily.  Also started patient on aspirin and Lipitor 20 mg daily.  We discussed at that time that it would be beneficial for her to pursue medical management for her migraine headaches and if this fails we will pursue investigating her PFO for possible closure.  She preferred not to pursue any TEE.   At her visit in June 2021 we continue her amlodipine aspirin as well as her statin.  We did again speak about her PFO bubble study and the patient preferred to not pursue any TEE.  I saw the patient on May 20, 2020 at that time she had been started on medications for her migraines.  At that visit she had not been taking her amlodipine therefore I told her to take her blood pressure daily.  She is here today she tells me she is doing well.  She still is not taking her amlodipine.  She was last seen by me in Dec 2023 at that time she was doing well from a CV standpoint and has stopped the Amlodipine due to low blood pressure.   She is here today with her wife.  She was started on Depakote and nortriptyline, which helped with both the migraines and back pain. However, she developed fluid retention and was prescribed Lasix. After taking Lasix for two to three days, she experienced a syncopal episode and was advised to discontinue  the medication. She has since been managing her hydration with electrolyte drinks but still has fluid retention and leg swelling.      Past Medical History:  Diagnosis Date   Acute non-recurrent sinusitis 01/16/2019   Bronchitis 05/04/2020   Constipation 04/17/2020   COVID-19 04/17/2020   Encounter for annual physical examination excluding gynecological examination in a patient older than 17 years 04/17/2017   Headache    History of TIA (transient ischemic attack)    Hospital discharge follow-up 11/18/2019   Hypokalemia 11/18/2019   Hypothyroidism (acquired) 04/17/2017   Lumbar radicular pain 06/14/2017   Migraine without status migrainosus, not intractable 09/14/2017   Nausea 05/04/2020   Polymenorrhea 06/13/2019   Reactive thrombocytosis 11/18/2019   Sore throat 10/25/2017   Spondylosis of lumbar spine 06/14/2017   Tendinitis of left rotator cuff 11/03/2017    Past Surgical History:  Procedure Laterality Date   CARDIAC CATHETERIZATION  2017   No stents put in    Current Medications: Current Meds  Medication Sig   cyclobenzaprine (FLEXERIL) 10 MG tablet Take 10 mg by mouth 3 (three) times daily.   diazepam (VALIUM) 5 MG tablet Take 5 mg by mouth as needed.   famotidine (PEPCID) 40 MG tablet Take 1 tablet by mouth daily.   fluticasone (FLONASE) 50 MCG/ACT nasal spray Place into the nose.  furosemide (LASIX) 20 MG tablet Take 1 tablet (20 mg total) by mouth once a week.   levothyroxine (SYNTHROID) 75 MCG tablet Take by mouth daily.   meloxicam (MOBIC) 7.5 MG tablet Take 7.5-15 mg by mouth daily.   montelukast (SINGULAIR) 10 MG tablet Take 10 mg by mouth at bedtime.   pantoprazole (PROTONIX) 40 MG tablet Take 40 mg by mouth daily.   potassium chloride (KLOR-CON) 10 MEQ tablet Take 1 tablet (10 mEq total) by mouth once a week.   pregabalin (LYRICA) 150 MG capsule Take 150 mg by mouth 2 (two) times daily.   rizatriptan (MAXALT) 5 MG tablet Take by mouth daily.   [DISCONTINUED]  amLODipine (NORVASC) 2.5 MG tablet Take 1 tablet (2.5 mg total) by mouth daily.   [DISCONTINUED] ASPIRIN 81 81 MG chewable tablet Chew 1 tablet (81 mg total) by mouth daily.   [DISCONTINUED] atorvastatin (LIPITOR) 20 MG tablet Take 1 tablet by mouth once daily     Allergies:   Nortriptyline   Social History   Socioeconomic History   Marital status: Married    Spouse name: Not on file   Number of children: Not on file   Years of education: Not on file   Highest education level: Not on file  Occupational History   Not on file  Tobacco Use   Smoking status: Former    Current packs/day: 0.00    Types: Cigarettes    Quit date: 11/13/1999    Years since quitting: 23.7   Smokeless tobacco: Never  Substance and Sexual Activity   Alcohol use: Not on file    Comment: Occasional   Drug use: Never   Sexual activity: Not on file  Other Topics Concern   Not on file  Social History Narrative   Not on file   Social Drivers of Health   Financial Resource Strain: Not on file  Food Insecurity: Low Risk  (12/07/2022)   Received from [facility]   Hunger Vital Sign    Worried About Running Out of Food in the Last Year: Never true    Ran Out of Food in the Last Year: Never true  Transportation Needs: No Transportation Needs (12/07/2022)   Received from [facility]    In the past 12 months, has lack of reliable transportation kept you from medical appointments, meetings, work or from getting things needed for daily living? : No  Physical Activity: Not on file  Stress: Not on file  Social Connections: Not on file     Family History: The patient's family history includes Heart attack in her maternal grandfather; Heart murmur in her mother.  ROS:   Review of Systems  She reports headache and all others negative   EKGs/Labs/Other Studies Reviewed:    The following studies were reviewed today:   EKG:  The ekg ordered today demonstrates sinus rhythm with first  degree AV block  TTE Impression Normal lv chamber size and wall thickness. LV EF 55-60%. Grade I Diastolic dysfunction. LA moderately dilated. PFO noted with positive bubble study.   MRI HEAD WITHOUT CONTRAST 11/1019/2 TECHNIQUE: Multiplanar, multiecho pulse sequences of the brain and surrounding structures were obtained without intravenous contrast. COMPARISON:  CT angio head neck 11/10/2019 FINDINGS: Brain: No acute infarction, hemorrhage, hydrocephalus, extra-axial collection or mass lesion. 3 mm hyperintensity left frontal deep white matter. Otherwise normal white matter. Normal brainstem.. Vascular: Normal arterial flow voids Skull and upper cervical spine: No focal skeletal lesion. Sinuses/Orbits: Negative Other: None  IMPRESSION: No acute abnormality. Solitary 3 mm left frontal white matter hyperintensity is nonspecific but may be related to chronic microvascular ischemia. No features characteristic of demyelinating disease.     CTA NECK FINDINGS 11/10/2019 Aortic arch: Examination degraded by motion artifact and timing of the contrast bolus. Visualized aortic arch of normal caliber with normal 3 vessel morphology. No flow-limiting stenosis seen about the origin of the great vessels. Visualized subclavian arteries widely patent. Right carotid system: Right common and internal carotid arteries grossly patent without stenosis, dissection, or occlusion. Evaluation limited by motion. Left carotid system: Left common and internal carotid arteries grossly patent without stenosis, dissection or occlusion. Evaluation limited by motion. Vertebral arteries: Both vertebral arteries arise from the subclavian arteries. Left vertebral artery dominant. Vertebral arteries patent without stenosis, dissection or occlusion. Evaluation limited by motion. Skeleton: No acute osseous abnormality. No visible discrete osseous lesions. Other neck: No other acute soft tissue abnormality within the  neck. Upper chest: Visualized upper chest demonstrates no acute finding. Review of the MIP images confirms the above findings   CTA HEAD FINDINGS   Anterior circulation: Examination markedly degraded by motion and timing of the contrast bolus.   Internal carotid arteries grossly patent to the termini without appreciable stenosis or other abnormality. A1 segments patent. Normal anterior communicating artery complex anterior cerebralarteries grossly patent to their distal aspects without stenosis. No M1 stenosis or occlusion. Normal MCA bifurcations. Distal MCA branches well perfused and symmetric.   Posterior circulation: Vertebral arteries patent to the vertebrobasilar junction without stenosis. Both picas patent.Basilar patent to its distal aspect without stenosis. Superior cerebral arteries patent bilaterally. Both PCAs primarily supplied via the basilar well perfused to their distal aspects. Venous sinuses: Patent.Anatomic variants: None significant.Review of the MIP images confirms the above findings IMPRESSION: 1. Technically limited exam due to motion artifact and timing the contrast bolus. 2. Grossly negative CTA of the neck. No large vessel occlusion,   hemodynamically significant stenosis, or other acute vascular abnormality.  Recent Labs: No results found for requested labs within last 365 days.  Recent Lipid Panel No results found for: "CHOL", "TRIG", "HDL", "CHOLHDL", "VLDL", "LDLCALC", "LDLDIRECT"  Physical Exam:    VS:  BP 138/88 (BP Location: Right Arm, Patient Position: Sitting, Cuff Size: Normal)   Pulse 62   Ht 5\' 6"  (1.676 m)   Wt 167 lb 6.4 oz (75.9 kg)   SpO2 99%   BMI 27.02 kg/m     Wt Readings from Last 3 Encounters:  07/28/23 167 lb 6.4 oz (75.9 kg)  06/23/22 169 lb 12.8 oz (77 kg)  06/14/21 157 lb 12.8 oz (71.6 kg)     GEN: Well nourished, well developed in no acute distress HEENT: Normal NECK: No JVD; No carotid bruits LYMPHATICS: No  lymphadenopathy CARDIAC: S1S2 noted,RRR, no murmurs, rubs, gallops RESPIRATORY:  Clear to auscultation without rales, wheezing or rhonchi  ABDOMEN: Soft, non-tender, non-distended, +bowel sounds, no guarding. EXTREMITIES: No edema, No cyanosis, no clubbing MUSCULOSKELETAL:  No deformity  SKIN: Warm and dry NEUROLOGIC:  Alert and oriented x 3, non-focal PSYCHIATRIC:  Normal affect, good insight  ASSESSMENT:    1. Primary hypertension   2. Medication management     PLAN:     Fluid Retention Patient reports leg swelling and pain, possibly related to Lasix use. -If Lasix at home, take 20mg  once weekly. If not, prescribe Lasix 20mg  once weekly. -Check kidney function today.  Hypertension Blood pressure fluctuating, patient recently experienced syncope so will preferred not  to increase antihypertensive medication at this time.  -Continue current management. -Check blood pressure daily at home and update in two weeks.  Medication Refills Patient requires refills for current medications. -Send in prescription refills for all current medications.  Follow-up Virtual visit in 16 weeks   No recurrent syncope or palpitations.  The patient understands the need to lose weight with diet and exercise. We have discussed specific strategies for this.   The patient is in agreement with the above plan. The patient left the office in stable condition.  The patient will follow up in   Medication Adjustments/Labs and Tests Ordered: Current medicines are reviewed at length with the patient today.  Concerns regarding medicines are outlined above.  Orders Placed This Encounter  Procedures   Comprehensive Metabolic Panel (CMET)   Magnesium   EKG 12-Lead   Meds ordered this encounter  Medications   furosemide (LASIX) 20 MG tablet    Sig: Take 1 tablet (20 mg total) by mouth once a week.    Dispense:  17 tablet    Refill:  3   potassium chloride (KLOR-CON) 10 MEQ tablet    Sig: Take 1  tablet (10 mEq total) by mouth once a week.    Dispense:  17 tablet    Refill:  3   ASPIRIN 81 81 MG chewable tablet    Sig: Chew 1 tablet (81 mg total) by mouth daily.    Dispense:  90 tablet    Refill:  3   amLODipine (NORVASC) 2.5 MG tablet    Sig: Take 1 tablet (2.5 mg total) by mouth daily.    Dispense:  90 tablet    Refill:  3   atorvastatin (LIPITOR) 20 MG tablet    Sig: Take 1 tablet (20 mg total) by mouth daily.    Dispense:  90 tablet    Refill:  3    Patient Instructions  Medication Instructions:  Your physician has recommended you make the following change in your medication:  START: Lasix 20 mg once weekly START: Potassium 10 mEq once weekly *If you need a refill on your cardiac medications before your next appointment, please call your pharmacy*   Lab Work: CMET, Mag If you have labs (blood work) drawn today and your tests are completely normal, you will receive your results only by: MyChart Message (if you have MyChart) OR A paper copy in the mail If you have any lab test that is abnormal or we need to change your treatment, we will call you to review the results.   Follow-Up: At [facility], you and your health needs are our priority.  As part of our continuing mission to provide you with exceptional heart care, we have created designated Provider Care Teams.  These Care Teams include your primary Cardiologist (physician) and Advanced Practice Providers (APPs -  Physician Assistants and Nurse Practitioners) who all work together to provide you with the care you need, when you need it.  Your next appointment:   16 week(s) via MyChart  Provider:   Thomasene Ripple, DO     Other Instructions Please take your blood pressure daily for 2 weeks and send in a MyChart message. Please include heart rates. (One message at the end of the 2 weeks).   HOW TO TAKE YOUR BLOOD PRESSURE: Rest 5 minutes before taking your blood pressure. Don't smoke or drink  caffeinated beverages for at least 30 minutes before. Take your blood pressure before (not after) you eat.  Sit comfortably with your back supported and both feet on the floor (don't cross your legs). Elevate your arm to heart level on a table or a desk. Use the proper sized cuff. It should fit smoothly and snugly around your bare upper arm. There should be enough room to slip a fingertip under the cuff. The bottom edge of the cuff should be 1 inch above the crease of the elbow. Ideally, take 3 measurements at one sitting and record the average.     Adopting a Healthy Lifestyle.  Know what a healthy weight is for you (roughly BMI <25) and aim to maintain this   Aim for 7+ servings of fruits and vegetables daily   65-80+ fluid ounces of water or unsweet tea for healthy kidneys   Limit to max 1 drink of alcohol per day; avoid smoking/tobacco   Limit animal fats in diet for cholesterol and heart health - choose grass fed whenever available   Avoid highly processed foods, and foods high in saturated/trans fats   Aim for low stress - take time to unwind and care for your mental health   Aim for 150 min of moderate intensity exercise weekly for heart health, and weights twice weekly for bone health   Aim for 7-9 hours of sleep daily   When it comes to diets, agreement about the perfect plan isnt easy to find, even among the experts. Experts at the [facility] [employer] developed an idea known as the Healthy Eating Plate. Just imagine a plate divided into logical, healthy portions.   The emphasis is on diet quality:   Load up on vegetables and fruits - one-half of your plate: Aim for color and variety, and remember that potatoes dont count.   Go for whole grains - one-quarter of your plate: Whole wheat, barley, wheat berries, quinoa, oats, brown rice, and foods made with them. If you want pasta, go with whole wheat pasta.   Protein power - one-quarter of your plate: Fish,  chicken, beans, and nuts are all healthy, versatile protein sources. Limit red meat.   The diet, however, does go beyond the plate, offering a few other suggestions.   Use healthy plant oils, such as olive, canola, soy, corn, sunflower and peanut. Check the labels, and avoid partially hydrogenated oil, which have unhealthy trans fats.   If youre thirsty, drink water. Coffee and tea are good in moderation, but skip sugary drinks and limit milk and dairy products to one or two daily servings.   The type of carbohydrate in the diet is more important than the amount. Some sources of carbohydrates, such as vegetables, fruits, whole grains, and beans-are healthier than others.   Finally, stay active  Signed, Thomasene Ripple, DO  07/28/2023 7:32 PM    [facility]

## 2023-07-28 NOTE — Patient Instructions (Signed)
Medication Instructions:  Your physician has recommended you make the following change in your medication:  START: Lasix 20 mg once weekly START: Potassium 10 mEq once weekly *If you need a refill on your cardiac medications before your next appointment, please call your pharmacy*   Lab Work: CMET, Mag If you have labs (blood work) drawn today and your tests are completely normal, you will receive your results only by: MyChart Message (if you have MyChart) OR A paper copy in the mail If you have any lab test that is abnormal or we need to change your treatment, we will call you to review the results.   Follow-Up: At [facility], you and your health needs are our priority.  As part of our continuing mission to provide you with exceptional heart care, we have created designated Provider Care Teams.  These Care Teams include your primary Cardiologist (physician) and Advanced Practice Providers (APPs -  Physician Assistants and Nurse Practitioners) who all work together to provide you with the care you need, when you need it.  Your next appointment:   16 week(s) via MyChart  Provider:   Thomasene Ripple, DO     Other Instructions Please take your blood pressure daily for 2 weeks and send in a MyChart message. Please include heart rates. (One message at the end of the 2 weeks).   HOW TO TAKE YOUR BLOOD PRESSURE: Rest 5 minutes before taking your blood pressure. Don't smoke or drink caffeinated beverages for at least 30 minutes before. Take your blood pressure before (not after) you eat. Sit comfortably with your back supported and both feet on the floor (don't cross your legs). Elevate your arm to heart level on a table or a desk. Use the proper sized cuff. It should fit smoothly and snugly around your bare upper arm. There should be enough room to slip a fingertip under the cuff. The bottom edge of the cuff should be 1 inch above the crease of the elbow. Ideally, take 3  measurements at one sitting and record the average.

## 2023-07-29 LAB — COMPREHENSIVE METABOLIC PANEL
ALT: 17 [IU]/L (ref 0–32)
AST: 26 [IU]/L (ref 0–40)
Albumin: 4.5 g/dL (ref 3.9–4.9)
Alkaline Phosphatase: 107 [IU]/L (ref 44–121)
BUN/Creatinine Ratio: 14 (ref 9–23)
BUN: 13 mg/dL (ref 6–24)
Bilirubin Total: 0.3 mg/dL (ref 0.0–1.2)
CO2: 24 mmol/L (ref 20–29)
Calcium: 9.5 mg/dL (ref 8.7–10.2)
Chloride: 103 mmol/L (ref 96–106)
Creatinine, Ser: 0.93 mg/dL (ref 0.57–1.00)
Globulin, Total: 2.3 g/dL (ref 1.5–4.5)
Glucose: 76 mg/dL (ref 70–99)
Potassium: 4.4 mmol/L (ref 3.5–5.2)
Sodium: 142 mmol/L (ref 134–144)
Total Protein: 6.8 g/dL (ref 6.0–8.5)
eGFR: 75 mL/min/{1.73_m2} (ref >59)

## 2023-07-29 LAB — MAGNESIUM: Magnesium: 2.6 mg/dL — ABNORMAL HIGH (ref 1.6–2.3)

## 2023-07-30 ENCOUNTER — Encounter: Payer: Self-pay | Admitting: Cardiology

## 2023-11-08 ENCOUNTER — Encounter: Payer: Self-pay | Admitting: Cardiology

## 2023-11-08 ENCOUNTER — Ambulatory Visit: Payer: BC Managed Care – PPO | Attending: Cardiology | Admitting: Cardiology

## 2023-11-08 VITALS — BP 132/73 | HR 71 | Ht 67.0 in | Wt 163.0 lb

## 2023-11-08 DIAGNOSIS — R609 Edema, unspecified: Secondary | ICD-10-CM

## 2023-11-08 DIAGNOSIS — I10 Essential (primary) hypertension: Secondary | ICD-10-CM

## 2023-11-08 MED ORDER — FUROSEMIDE 20 MG PO TABS: 20.0000 mg | ORAL_TABLET | ORAL | 3 refills | Status: DC

## 2023-11-08 MED ORDER — POTASSIUM CHLORIDE ER 10 MEQ PO TBCR: 10.0000 meq | EXTENDED_RELEASE_TABLET | ORAL | 3 refills | Status: DC

## 2023-11-08 NOTE — Patient Instructions (Signed)
 Medication Instructions:  Your physician has recommended you make the following change in your medication:  START: Lasix  20 mg every other day START: Potassium 10 mEq every other day *If you need a refill on your cardiac medications before your next appointment, please call your pharmacy*  Testing/Procedures: Your physician has requested that you have an echocardiogram. Echocardiography is a painless test that uses sound waves to create images of your heart. It provides your doctor with information about the size and shape of your heart and how well your heart's chambers and valves are working. This procedure takes approximately one hour. There are no restrictions for this procedure. Please do NOT wear cologne, perfume, aftershave, or lotions (deodorant is allowed). Please arrive 15 minutes prior to your appointment time.  Please note: We ask at that you not bring children with you during ultrasound (echo/ vascular) testing. Due to room size and safety concerns, children are not allowed in the ultrasound rooms during exams. Our front office staff cannot provide observation of children in our lobby area while testing is being conducted. An adult accompanying a patient to their appointment will only be allowed in the ultrasound room at the discretion of the ultrasound technician under special circumstances. We apologize for any inconvenience.   Follow-Up: At [facility], you and your health needs are our priority.  As part of our continuing mission to provide you with exceptional heart care, our providers are all part of one team.  This team includes your primary Cardiologist (physician) and Advanced Practice Providers or APPs (Physician Assistants and Nurse Practitioners) who all work together to provide you with the care you need, when you need it.  Your next appointment:   16 week(s)  Provider:   Kardie Tobb, DO

## 2023-11-08 NOTE — Progress Notes (Signed)
 Virtual Visit via Video Note   Because of Diane Evans's co-morbid illnesses, she is at least at moderate risk for complications without adequate follow up.  This format is felt to be most appropriate for this patient at this time.  All issues noted in this document were discussed and addressed.  A limited physical exam was performed with this format.  Please refer to the patient's chart for her consent to telehealth for [facility].      Date:  11/08/2023   ID:  Diane Evans, DOB 09-May-1976, MRN [id number]  Patient Location: Home Provider Location: Office/Clinic  PCP:  Melva Stabile, MD  Cardiologist:  Jerryl Morin, DO  Electrophysiologist:  None   Evaluation Performed:  Follow-Up Visit  Virtual Visit via Video  Note . I connected with the patient today by a   video enabled telemedicine application and verified that I am speaking with the correct person using two identifiers.  Chief Complaint:  " I am still having leg swelling"  History of Present Illness:    Diane Evans is a 48 y.o. female with a hx of hypertension, hypothyroidism, migraine headaches, recent concern for TIA which was admitted to [facility] in May 2012 where her echo showed evidence of positive bubble study noting a PFO.   Reports that she has not had any relief from the Lasix  since taking once daily. She use compression stockings which has not helped.  The patient does not have symptoms concerning for COVID-19 infection (fever, chills, cough, or new shortness of breath).    Past Medical History:  Diagnosis Date   Acute non-recurrent sinusitis 01/16/2019   Bronchitis 05/04/2020   Constipation 04/17/2020   COVID-19 04/17/2020   Encounter for annual physical examination excluding gynecological examination in a patient older than 17 years 04/17/2017   Headache    History of TIA (transient ischemic attack)    Hospital discharge follow-up 11/18/2019   Hypokalemia 11/18/2019   Hypothyroidism  (acquired) 04/17/2017   Lumbar radicular pain 06/14/2017   Migraine without status migrainosus, not intractable 09/14/2017   Nausea 05/04/2020   Polymenorrhea 06/13/2019   Reactive thrombocytosis 11/18/2019   Sore throat 10/25/2017   Spondylosis of lumbar spine 06/14/2017   Tendinitis of left rotator cuff 11/03/2017   Past Surgical History:  Procedure Laterality Date   CARDIAC CATHETERIZATION  2017   No stents put in     Current Meds  Medication Sig   amLODipine  (NORVASC ) 2.5 MG tablet Take 1 tablet (2.5 mg total) by mouth daily.   ASPIRIN  81 81 MG chewable tablet Chew 1 tablet (81 mg total) by mouth daily.   atorvastatin  (LIPITOR) 20 MG tablet Take 1 tablet (20 mg total) by mouth daily.   Calcium  Carbonate (CALCIUM  500 PO) Take by mouth daily.   cyclobenzaprine (FLEXERIL) 10 MG tablet Take 10 mg by mouth 3 (three) times daily.   diazepam (VALIUM) 5 MG tablet Take 5 mg by mouth as needed.   famotidine (PEPCID) 40 MG tablet Take 1 tablet by mouth daily.   Ferrous Sulfate (IRON) 325 (65 Fe) MG TABS Take by mouth daily.   fluticasone (FLONASE) 50 MCG/ACT nasal spray Place into the nose.   levothyroxine (SYNTHROID) 75 MCG tablet Take by mouth daily.   meloxicam (MOBIC) 7.5 MG tablet Take 7.5-15 mg by mouth daily.   montelukast (SINGULAIR) 10 MG tablet Take 10 mg by mouth at bedtime.   pantoprazole (PROTONIX) 40 MG tablet Take 40 mg by mouth daily.  pregabalin (LYRICA) 150 MG capsule Take 150 mg by mouth 2 (two) times daily.   rizatriptan (MAXALT) 5 MG tablet Take by mouth daily.     Allergies:   Nortriptyline   Social History   Tobacco Use   Smoking status: Former    Current packs/day: 0.00    Types: Cigarettes    Quit date: 11/13/1999    Years since quitting: 24.0   Smokeless tobacco: Never  Substance Use Topics   Drug use: Never     Family Hx: The patient's family history includes Heart attack in her maternal grandfather; Heart murmur in her mother.  ROS:   Please see  the history of present illness.     All other systems reviewed and are negative.   Prior CV studies:   The following studies were reviewed today: Zio monitor   Labs/Other Tests and Data Reviewed:    EKG:  No ECG reviewed.  Recent Labs: 07/28/2023: ALT 17; BUN 13; Creatinine, Ser 0.93; Magnesium 2.6; Potassium 4.4; Sodium 142   Recent Lipid Panel No results found for: "CHOL", "TRIG", "HDL", "CHOLHDL", "LDLCALC", "LDLDIRECT"  Wt Readings from Last 3 Encounters:  11/08/23 163 lb (73.9 kg)  07/28/23 167 lb 6.4 oz (75.9 kg)  06/23/22 169 lb 12.8 oz (77 kg)     Objective:    Vital Signs:  BP 132/73 (BP Location: Left Arm, Patient Position: Sitting, Cuff Size: Normal)   Pulse 71   Ht 5\' 7"  (1.702 m)   Wt 163 lb (73.9 kg)   BMI 25.53 kg/m      ASSESSMENT & PLAN:    Worsening leg edema Primary Hypertension    She has had no relief from the Lasix  20 mg weekly,. I would prefer Lasix  on a daily basis but the patient shared with me that she gets dehydrated quickly.  So we would do this every other day and monitor her.  In the meantime with this change I am going to get an echocardiogram to make sure that her heart function is still within normal range and that there is no valvular abnormalities as well. Her echo several years ago was normal from [facility].    We will see her in follow up   COVID-19 Education: The signs and symptoms of COVID-19 were discussed with the patient and how to seek care for testing (follow up with PCP or arrange E-visit).  The importance of social distancing was discussed today.  Time:   Today, I have spent 12 minutes with the patient with telehealth technology discussing the above problems.     Medication Adjustments/Labs and Tests Ordered: Current medicines are reviewed at length with the patient today.  Concerns regarding medicines are outlined above.   Tests Ordered: Orders Placed This Encounter  Procedures   ECHOCARDIOGRAM COMPLETE     Medication Changes: Meds ordered this encounter  Medications   furosemide  (LASIX ) 20 MG tablet    Sig: Take 1 tablet (20 mg total) by mouth every other day.    Dispense:  45 tablet    Refill:  3   potassium chloride  (KLOR-CON ) 10 MEQ tablet    Sig: Take 1 tablet (10 mEq total) by mouth every other day.    Dispense:  45 tablet    Refill:  3    Follow Up:  In Person   Signed, Piedad Standiford, DO  11/08/2023 10:06 AM    [facility]

## 2023-11-28 ENCOUNTER — Encounter: Payer: Self-pay | Admitting: Cardiology

## 2023-12-01 ENCOUNTER — Ambulatory Visit: Payer: Self-pay | Attending: Cardiology

## 2023-12-01 ENCOUNTER — Other Ambulatory Visit: Payer: Self-pay

## 2023-12-01 DIAGNOSIS — R600 Localized edema: Secondary | ICD-10-CM

## 2023-12-01 DIAGNOSIS — R609 Edema, unspecified: Secondary | ICD-10-CM

## 2023-12-01 LAB — ECHOCARDIOGRAM COMPLETE
Area-P 1/2: 4.86 cm2
Calc EF: 52.3 %
S' Lateral: 3 cm
Single Plane A2C EF: 54.1 %
Single Plane A4C EF: 51.4 %

## 2023-12-01 MED ORDER — FUROSEMIDE 20 MG PO TABS: 20.0000 mg | ORAL_TABLET | Freq: Every day | ORAL | 3 refills | Status: DC

## 2023-12-01 NOTE — Progress Notes (Signed)
 Medication list updated: Lasix  20 mg daily per Dr. Emmette Harms. New prescription sent to pt's preferred pharmacy.

## 2023-12-04 ENCOUNTER — Ambulatory Visit: Payer: Self-pay | Admitting: Cardiology

## 2023-12-07 ENCOUNTER — Other Ambulatory Visit: Payer: Self-pay

## 2023-12-07 MED ORDER — POTASSIUM CHLORIDE ER 10 MEQ PO TBCR: 10.0000 meq | EXTENDED_RELEASE_TABLET | Freq: Every day | ORAL | 3 refills | Status: DC

## 2023-12-07 NOTE — Progress Notes (Signed)
 Updated potassium prescription sent to preferred pharmacy.

## 2024-02-15 ENCOUNTER — Encounter: Payer: Self-pay | Admitting: Cardiology

## 2024-03-06 ENCOUNTER — Encounter: Payer: Self-pay | Admitting: *Deleted

## 2024-03-08 ENCOUNTER — Ambulatory Visit: Attending: Cardiology | Admitting: Cardiology

## 2024-03-08 ENCOUNTER — Encounter: Payer: Self-pay | Admitting: Cardiology

## 2024-03-08 VITALS — BP 120/90 | HR 60 | Ht 66.0 in | Wt 171.3 lb

## 2024-03-08 DIAGNOSIS — I10 Essential (primary) hypertension: Secondary | ICD-10-CM

## 2024-03-08 DIAGNOSIS — Z79899 Other long term (current) drug therapy: Secondary | ICD-10-CM | POA: Diagnosis not present

## 2024-03-08 DIAGNOSIS — I872 Venous insufficiency (chronic) (peripheral): Secondary | ICD-10-CM

## 2024-03-08 MED ORDER — POTASSIUM CHLORIDE CRYS ER 20 MEQ PO TBCR: 20.0000 meq | EXTENDED_RELEASE_TABLET | Freq: Every day | ORAL | 3 refills | Status: AC

## 2024-03-08 MED ORDER — FUROSEMIDE 40 MG PO TABS: 40.0000 mg | ORAL_TABLET | Freq: Every day | ORAL | 3 refills | Status: AC

## 2024-03-08 NOTE — Patient Instructions (Signed)
 Medication Instructions:  Your physician has recommended you make the following change in your medication:  INCREASE: Lasix  40 mg once daily INCREASE: Potassium 20 mEq once daily  *If you need a refill on your cardiac medications before your next appointment, please call your pharmacy*  Lab Work: CMET, Mag If you have labs (blood work) drawn today and your tests are completely normal, you will receive your results only by: MyChart Message (if you have MyChart) OR A paper copy in the mail If you have any lab test that is abnormal or we need to change your treatment, we will call you to review the results.   Follow-Up: At [facility], you and your health needs are our priority.  As part of our continuing mission to provide you with exceptional heart care, our providers are all part of one team.  This team includes your primary Cardiologist (physician) and Advanced Practice Providers or APPs (Physician Assistants and Nurse Practitioners) who all work together to provide you with the care you need, when you need it.  Your next appointment:   6 month(s)  Provider:   Kardie Tobb, DO

## 2024-03-08 NOTE — Progress Notes (Signed)
 Cardiology Office Note:    Date:  03/09/2024   ID:  Diane Evans, DOB Aug 29, 1975, MRN [id number]  PCP:  Silver Lamar LABOR, MD  Cardiologist:  Dub Huntsman, DO  Electrophysiologist:  None   Referring MD: Silver Lamar LABOR, MD    I am having leg swelling  History of Present Illness:    Diane Evans is a 48 y.o. female with a hx of hypertension, hypothyroidism, migraine headaches, recent concern for TIA which was admitted to [facility] in May 2012 where her echo showed evidence of positive bubble study noting a PFO.   At her last visit she reported increased leg swelling.  At that visit I increased her Lasix  to 20 mg daily.  Today she is here with her significant other and she reports that she has been having some swelling especially at the end of her long days and is painful.   Past Medical History:  Diagnosis Date   Acute non-recurrent sinusitis 01/16/2019   Bronchitis 05/04/2020   Constipation 04/17/2020   COVID-19 04/17/2020   Encounter for annual physical examination excluding gynecological examination in a patient older than 17 years 04/17/2017   Headache    History of TIA (transient ischemic attack)    Hospital discharge follow-up 11/18/2019   Hypokalemia 11/18/2019   Hypothyroidism (acquired) 04/17/2017   Lumbar radicular pain 06/14/2017   Migraine without status migrainosus, not intractable 09/14/2017   Nausea 05/04/2020   Polymenorrhea 06/13/2019   Reactive thrombocytosis 11/18/2019   Sore throat 10/25/2017   Spondylosis of lumbar spine 06/14/2017   Tendinitis of left rotator cuff 11/03/2017    Past Surgical History:  Procedure Laterality Date   CARDIAC CATHETERIZATION  2017   No stents put in    Current Medications: Current Meds  Medication Sig   ASPIRIN  81 81 MG chewable tablet Chew 1 tablet (81 mg total) by mouth daily.   atorvastatin  (LIPITOR) 20 MG tablet Take 1 tablet (20 mg total) by mouth daily.   Calcium  Carbonate (CALCIUM  500 PO) Take by mouth  daily.   cyclobenzaprine (FLEXERIL) 10 MG tablet Take 10 mg by mouth 3 (three) times daily.   diazepam (VALIUM) 5 MG tablet Take 5 mg by mouth as needed.   famotidine (PEPCID) 40 MG tablet Take 1 tablet by mouth daily.   Ferrous Sulfate (IRON) 325 (65 Fe) MG TABS Take by mouth daily.   fluticasone (FLONASE) 50 MCG/ACT nasal spray Place into the nose.   furosemide  (LASIX ) 40 MG tablet Take 1 tablet (40 mg total) by mouth daily.   levothyroxine (SYNTHROID) 75 MCG tablet Take by mouth daily.   magnesium oxide (MAG-OX) 400 MG tablet Take 400 mg by mouth daily.   montelukast (SINGULAIR) 10 MG tablet Take 10 mg by mouth at bedtime.   pantoprazole (PROTONIX) 40 MG tablet Take 40 mg by mouth daily.   potassium chloride  SA (KLOR-CON  M20) 20 MEQ tablet Take 1 tablet (20 mEq total) by mouth daily.   pregabalin (LYRICA) 150 MG capsule Take 150 mg by mouth 2 (two) times daily.   rizatriptan (MAXALT) 5 MG tablet Take by mouth daily.   [DISCONTINUED] furosemide  (LASIX ) 20 MG tablet Take 1 tablet (20 mg total) by mouth daily.   [DISCONTINUED] potassium chloride  (KLOR-CON ) 10 MEQ tablet Take 1 tablet (10 mEq total) by mouth daily.     Allergies:   Nortriptyline   Social History   Socioeconomic History   Marital status: Married    Spouse name: Not on file   Number  of children: Not on file   Years of education: Not on file   Highest education level: Not on file  Occupational History   Not on file  Tobacco Use   Smoking status: Former    Current packs/day: 0.00    Types: Cigarettes    Quit date: 11/13/1999    Years since quitting: 24.3   Smokeless tobacco: Never  Substance and Sexual Activity   Alcohol use: Not on file    Comment: Occasional   Drug use: Never   Sexual activity: Not on file  Other Topics Concern   Not on file  Social History Narrative   Not on file   Social Drivers of Health   Financial Resource Strain: Low Risk  (11/14/2023)   Received from [facility]   Overall  Financial Resource Strain (CARDIA)    Difficulty of Paying Living Expenses: Not hard at all  Food Insecurity: Low Risk  (02/15/2024)   Received from [facility]   Hunger Vital Sign    Within the past 12 months, you worried that your food would run out before you got money to buy more: Never true    Within the past 12 months, the food you bought just didn't last and you didn't have money to get more. : Never true  Transportation Needs: No Transportation Needs (02/15/2024)   Received from [facility]    In the past 12 months, has lack of reliable transportation kept you from medical appointments, meetings, work or from getting things needed for daily living? : No  Physical Activity: Not on file  Stress: Not on file  Social Connections: Not on file     Family History: The patient's family history includes Heart attack in her maternal grandfather; Heart murmur in her mother.  ROS:   Review of Systems  Constitution: Negative for decreased appetite, fever and weight gain.  HENT: Negative for congestion, ear discharge, hoarse voice and sore throat.   Eyes: Negative for discharge, redness, vision loss in right eye and visual halos.  Cardiovascular: Negative for chest pain, dyspnea on exertion, leg swelling, orthopnea and palpitations.  Respiratory: Negative for cough, hemoptysis, shortness of breath and snoring.   Endocrine: Negative for heat intolerance and polyphagia.  Hematologic/Lymphatic: Negative for bleeding problem. Does not bruise/bleed easily.  Skin: Negative for flushing, nail changes, rash and suspicious lesions.  Musculoskeletal: Negative for arthritis, joint pain, muscle cramps, myalgias, neck pain and stiffness.  Gastrointestinal: Negative for abdominal pain, bowel incontinence, diarrhea and excessive appetite.  Genitourinary: Negative for decreased libido, genital sores and incomplete emptying.  Neurological: Negative for brief paralysis, focal weakness,  headaches and loss of balance.  Psychiatric/Behavioral: Negative for altered mental status, depression and suicidal ideas.  Allergic/Immunologic: Negative for HIV exposure and persistent infections.    EKGs/Labs/Other Studies Reviewed:    The following studies were reviewed today:   EKG:  The ekg ordered today demonstrates sinus rhythm with first-degree AV block  Recent Labs: 03/08/2024: ALT 21; BUN 18; Creatinine, Ser 1.09; Magnesium 2.2; Potassium 3.8; Sodium 144  Recent Lipid Panel No results found for: CHOL, TRIG, HDL, CHOLHDL, VLDL, LDLCALC, LDLDIRECT  Physical Exam:    VS:  BP (!) 120/90 (BP Location: Right Arm, Patient Position: Sitting, Cuff Size: Normal)   Pulse 60   Ht 5' 6 (1.676 m)   Wt 171 lb 4.8 oz (77.7 kg)   SpO2 94%   BMI 27.65 kg/m     Wt Readings from Last 3 Encounters:  03/08/24 171 lb 4.8 oz (77.7 kg)  11/08/23 163 lb (73.9 kg)  07/28/23 167 lb 6.4 oz (75.9 kg)     GEN: Well nourished, well developed in no acute distress HEENT: Normal NECK: No JVD; No carotid bruits LYMPHATICS: No lymphadenopathy CARDIAC: S1S2 noted,RRR, no murmurs, rubs, gallops RESPIRATORY:  Clear to auscultation without rales, wheezing or rhonchi  ABDOMEN: Soft, non-tender, non-distended, +bowel sounds, no guarding. EXTREMITIES: Trace bilateral edema, No cyanosis, no clubbing MUSCULOSKELETAL:  No deformity  SKIN: Warm and dry NEUROLOGIC:  Alert and oriented x 3, non-focal PSYCHIATRIC:  Normal affect, good insight  ASSESSMENT:    1. Primary hypertension   2. Medication management   3. Venous insufficiency    PLAN:     1.  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  I explained to the patient given her venous insufficiency she will experience some swelling.  Encouraged her to continue to use support hose. I will refer her to our vascular/vein doctors.  2.  Diastolic hypertension-she has previously been on amlodipine  which made her  significantly hypotensive.  Will see if the increase in Lasix  also help with the diastolic pressure.  3.  Diet modification advised  The patient is in agreement with the above plan. The patient left the office in stable condition.  The patient will follow up in   Medication Adjustments/Labs and Tests Ordered: Current medicines are reviewed at length with the patient today.  Concerns regarding medicines are outlined above.  Orders Placed This Encounter  Procedures   Comp Met (CMET)   Magnesium   Ambulatory referral to Vascular Surgery   EKG 12-Lead   Meds ordered this encounter  Medications   furosemide  (LASIX ) 40 MG tablet    Sig: Take 1 tablet (40 mg total) by mouth daily.    Dispense:  90 tablet    Refill:  3   potassium chloride  SA (KLOR-CON  M20) 20 MEQ tablet    Sig: Take 1 tablet (20 mEq total) by mouth daily.    Dispense:  90 tablet    Refill:  3    Patient Instructions  Medication Instructions:  Your physician has recommended you make the following change in your medication:  INCREASE: Lasix  40 mg once daily INCREASE: Potassium 20 mEq once daily  *If you need a refill on your cardiac medications before your next appointment, please call your pharmacy*  Lab Work: CMET, Mag If you have labs (blood work) drawn today and your tests are completely normal, you will receive your results only by: MyChart Message (if you have MyChart) OR A paper copy in the mail If you have any lab test that is abnormal or we need to change your treatment, we will call you to review the results.   Follow-Up: At [facility], you and your health needs are our priority.  As part of our continuing mission to provide you with exceptional heart care, our providers are all part of one team.  This team includes your primary Cardiologist (physician) and Advanced Practice Providers or APPs (Physician Assistants and Nurse Practitioners) who all work together to provide you with the care you  need, when you need it.  Your next appointment:   6 month(s)  Provider:   Dellamae Rosamilia, DO       Adopting a Healthy Lifestyle.  Know what a healthy weight is for you (roughly BMI <25) and aim to maintain this   Aim for 7+ servings of fruits and  vegetables daily   65-80+ fluid ounces of water or unsweet tea for healthy kidneys   Limit to max 1 drink of alcohol per day; avoid smoking/tobacco   Limit animal fats in diet for cholesterol and heart health - choose grass fed whenever available   Avoid highly processed foods, and foods high in saturated/trans fats   Aim for low stress - take time to unwind and care for your mental health   Aim for 150 min of moderate intensity exercise weekly for heart health, and weights twice weekly for bone health   Aim for 7-9 hours of sleep daily   When it comes to diets, agreement about the perfect plan isnt easy to find, even among the experts. Experts at the [facility] [employer] developed an idea known as the Healthy Eating Plate. Just imagine a plate divided into logical, healthy portions.   The emphasis is on diet quality:   Load up on vegetables and fruits - one-half of your plate: Aim for color and variety, and remember that potatoes dont count.   Go for whole grains - one-quarter of your plate: Whole wheat, barley, wheat berries, quinoa, oats, brown rice, and foods made with them. If you want pasta, go with whole wheat pasta.   Protein power - one-quarter of your plate: Fish, chicken, beans, and nuts are all healthy, versatile protein sources. Limit red meat.   The diet, however, does go beyond the plate, offering a few other suggestions.   Use healthy plant oils, such as olive, canola, soy, corn, sunflower and peanut. Check the labels, and avoid partially hydrogenated oil, which have unhealthy trans fats.   If youre thirsty, drink water. Coffee and tea are good in moderation, but skip sugary drinks and limit milk and  dairy products to one or two daily servings.   The type of carbohydrate in the diet is more important than the amount. Some sources of carbohydrates, such as vegetables, fruits, whole grains, and beans-are healthier than others.   Finally, stay active  Signed, Keona Bilyeu, DO  03/09/2024 2:35 PM    [facility]

## 2024-03-09 LAB — COMPREHENSIVE METABOLIC PANEL WITH GFR
ALT: 21 IU/L (ref 0–32)
AST: 23 IU/L (ref 0–40)
Albumin: 4.5 g/dL (ref 3.9–4.9)
Alkaline Phosphatase: 102 IU/L (ref 44–121)
BUN/Creatinine Ratio: 17 (ref 9–23)
BUN: 18 mg/dL (ref 6–24)
Bilirubin Total: 0.5 mg/dL (ref 0.0–1.2)
CO2: 23 mmol/L (ref 20–29)
Calcium: 9.4 mg/dL (ref 8.7–10.2)
Chloride: 104 mmol/L (ref 96–106)
Creatinine, Ser: 1.09 mg/dL — ABNORMAL HIGH (ref 0.57–1.00)
Globulin, Total: 2.2 g/dL (ref 1.5–4.5)
Glucose: 101 mg/dL — ABNORMAL HIGH (ref 70–99)
Potassium: 3.8 mmol/L (ref 3.5–5.2)
Sodium: 144 mmol/L (ref 134–144)
Total Protein: 6.7 g/dL (ref 6.0–8.5)
eGFR: 62 mL/min/1.73 (ref >59)

## 2024-03-09 LAB — MAGNESIUM: Magnesium: 2.2 mg/dL (ref 1.6–2.3)

## 2024-03-11 ENCOUNTER — Ambulatory Visit: Payer: Self-pay | Admitting: Cardiology

## 2024-03-11 DIAGNOSIS — I10 Essential (primary) hypertension: Secondary | ICD-10-CM

## 2024-03-11 DIAGNOSIS — Z79899 Other long term (current) drug therapy: Secondary | ICD-10-CM

## 2024-03-29 IMAGING — MG MM BREAST BX W LOC DEV 1ST LESION IMAGE BX SPEC STEREO GUIDE*L*
8 of 13 series · 8 of 29 positions shown · non-contrast
Comparison: None Available.
COMPARISON: None Available.

Addendum:
CLINICAL DATA: 46-year-old female presenting for biopsy of an
asymmetry with calcifications in the left breast.

EXAM:
LEFT BREAST STEREOTACTIC CORE NEEDLE BIOPSY

[L (1 of 8)]
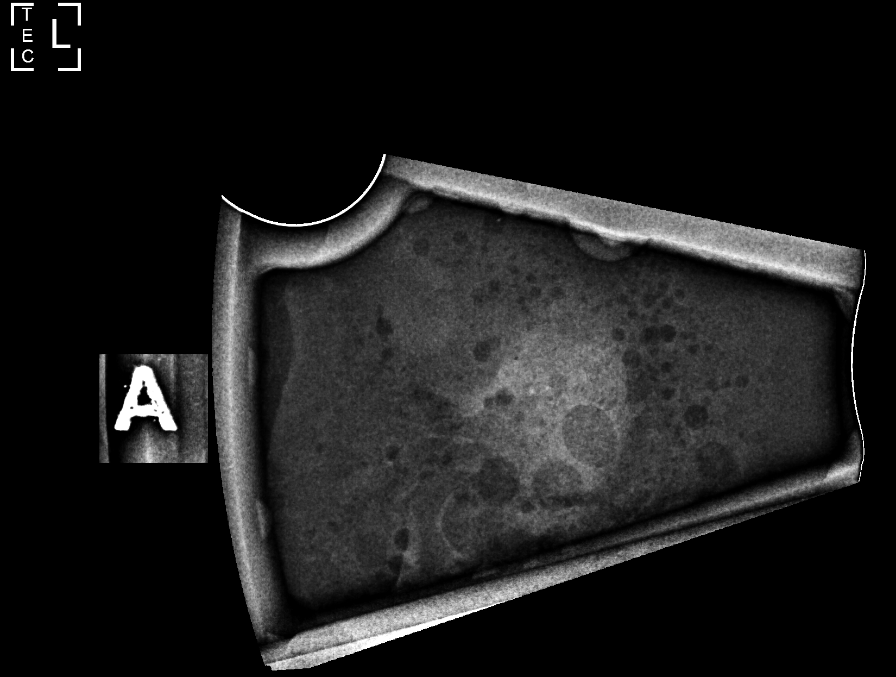

[L (2 of 8)]
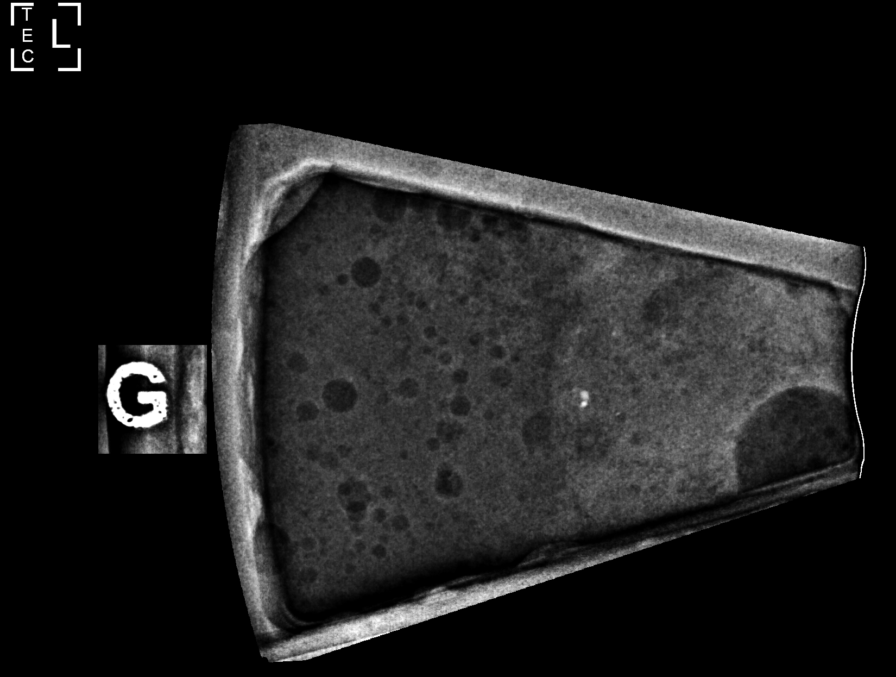

[L (3 of 8)]
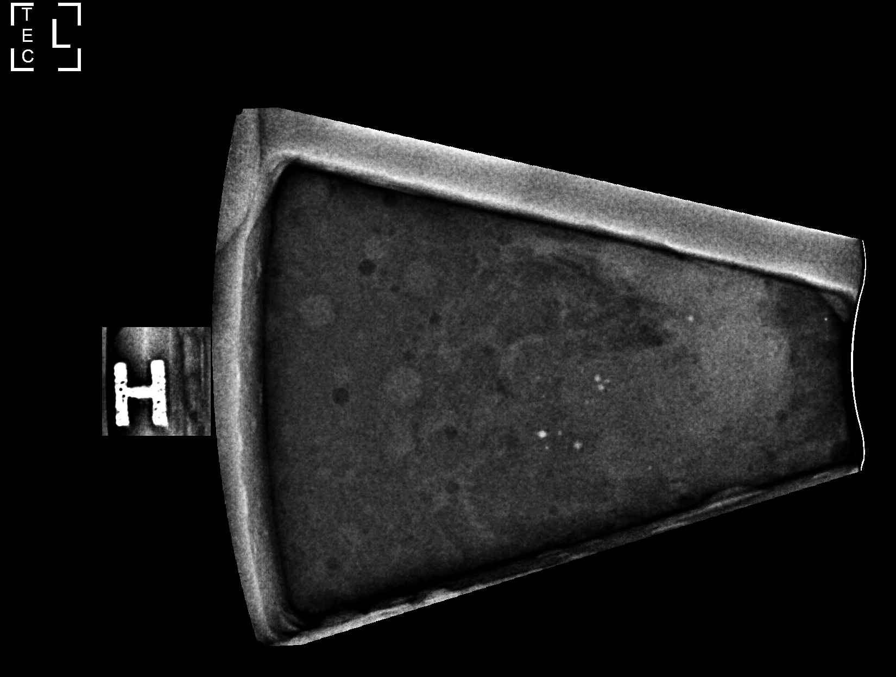

[L (4 of 8)]
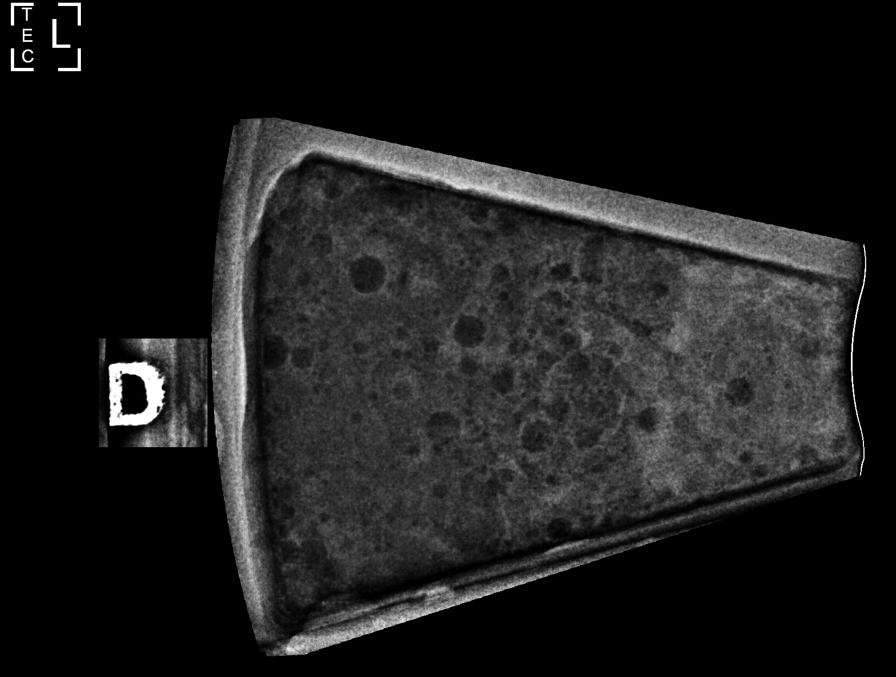

[L (5 of 8)]
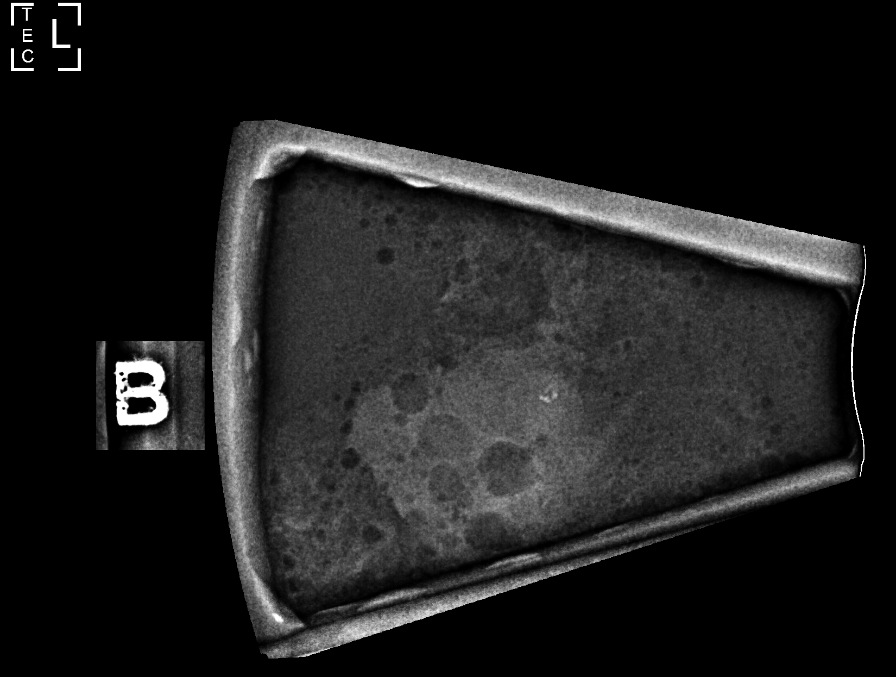

[L (6 of 8)]
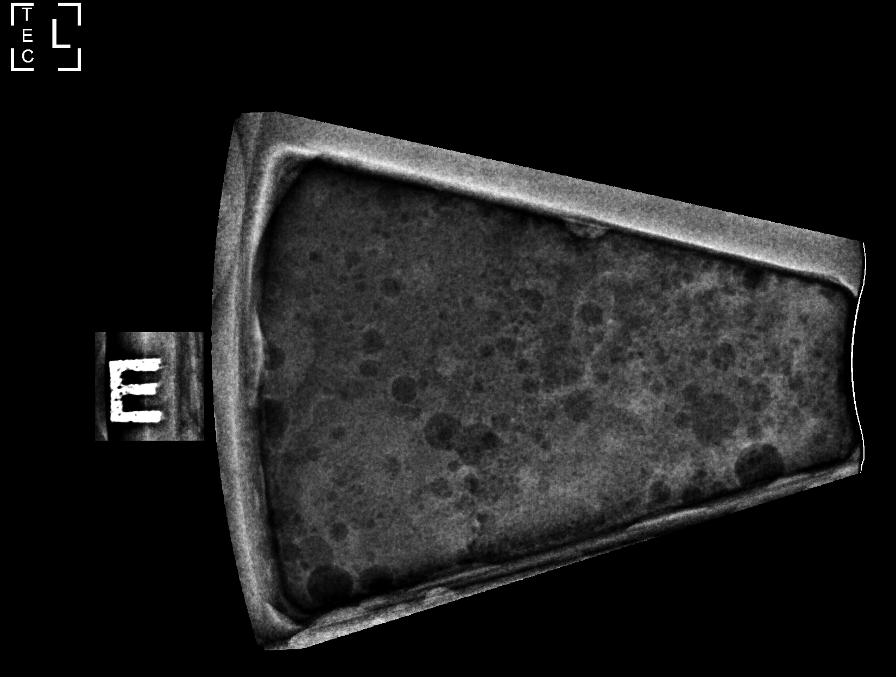

[L (7 of 8)]
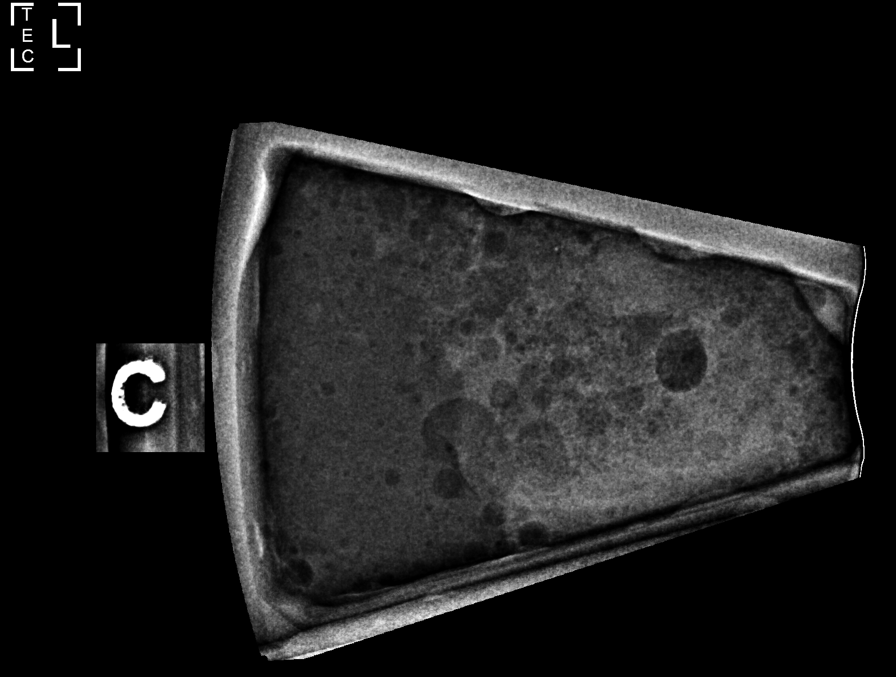

[L (8 of 8)]
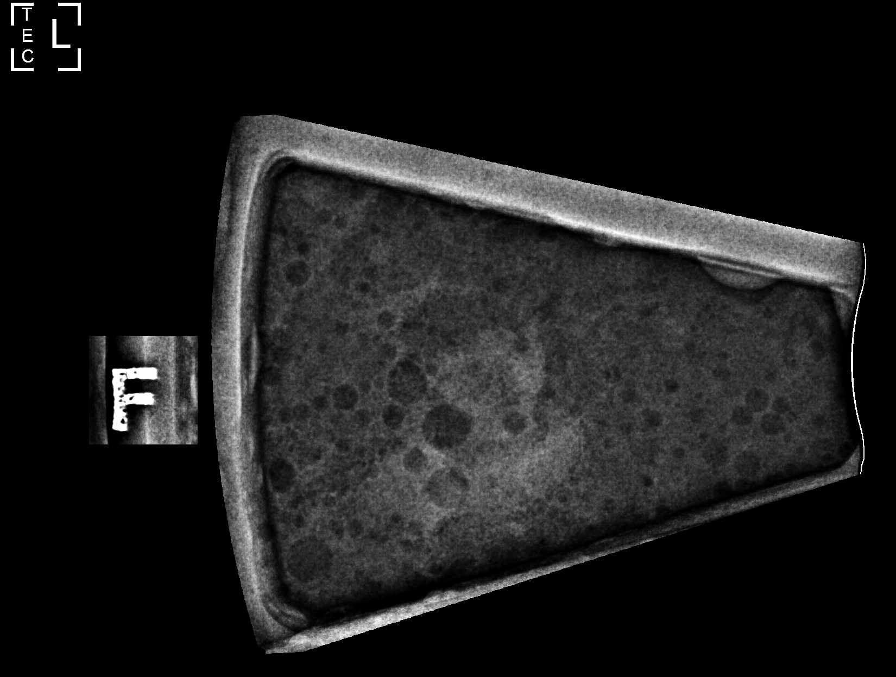

[8 of 29 positions shown; findings below may reference images not displayed]



Using sterile technique and 1% Lidocaine as local anesthetic, under
stereotactic guidance, a 9 gauge vacuum assisted device was used to
perform core needle biopsy of an asymmetry with calcifications in
the upper inner left breast using a superior approach. Specimen
radiograph was performed showing at least 2 specimens with
calcifications. Specimens with calcifications are identified for
pathology.

Lesion quadrant: Upper inner quadrant

At the conclusion of the procedure, a coil shaped tissue marker clip
was deployed into the biopsy cavity. Follow-up 2-view mammogram was
performed and dictated separately.
IMPRESSION: Stereotactic-guided biopsy of calcifications with an asymmetry in
the upper inner left breast. No apparent complications.

ADDENDUM:
Pathology revealed FIBROCYSTIC CHANGES INCLUDING APOCRINE METAPLASIA
WITH CALCIFICATIONS of the LEFT breast, upper inner, (coil clip).
This was found to be concordant by Dr. Taliyah Mwaura.

Pathology results were discussed with the patient by telephone. The
patient reported doing well after the biopsy with tenderness at the
site. Post biopsy instructions and care were reviewed and questions
were answered. The patient was encouraged to call The [REDACTED]

The patient was instructed to return for annual screening
mammography at [REDACTED] in [HOSPITAL][HOSPITAL].

Pathology results reported by Mudther, RN on 11/25/2021.



Using sterile technique and 1% Lidocaine as local anesthetic, under
stereotactic guidance, a 9 gauge vacuum assisted device was used to
perform core needle biopsy of an asymmetry with calcifications in
the upper inner left breast using a superior approach. Specimen
radiograph was performed showing at least 2 specimens with
calcifications. Specimens with calcifications are identified for
pathology.

Lesion quadrant: Upper inner quadrant

At the conclusion of the procedure, a coil shaped tissue marker clip
was deployed into the biopsy cavity. Follow-up 2-view mammogram was
performed and dictated separately.
IMPRESSION: Stereotactic-guided biopsy of calcifications with an asymmetry in
the upper inner left breast. No apparent complications.

## 2024-06-07 ENCOUNTER — Other Ambulatory Visit: Payer: Self-pay

## 2024-06-10 ENCOUNTER — Other Ambulatory Visit: Payer: Self-pay

## 2024-06-10 DIAGNOSIS — I872 Venous insufficiency (chronic) (peripheral): Secondary | ICD-10-CM

## 2024-07-11 NOTE — Progress Notes (Signed)
 "  Patient ID: Darice Lute, female   DOB: Apr 21, 1976, 49 y.o.   MRN: [id number]  Reason for Consult: New Patient (Initial Visit)   Referred by Sheena Pugh, DO  Subjective:     HPI  Marriah Sanderlin is a 49 y.o. female who presents for evaluation of lower extremity swelling and pain Timeframe: About 1 year Symptoms: Aching and heaviness Varicosities: No Previous wounds: No Previous DVT: No In compression: Yes but do not fit well  Of note she also has a chronic right knee injury and has been struggling with knee pain and joint swelling  Past Medical History:  Diagnosis Date   Acute non-recurrent sinusitis 01/16/2019   Bronchitis 05/04/2020   Constipation 04/17/2020   COVID-19 04/17/2020   Encounter for annual physical examination excluding gynecological examination in a patient older than 17 years 04/17/2017   Headache    History of TIA (transient ischemic attack)    Hospital discharge follow-up 11/18/2019   Hyperlipidemia    Hypertension    Hypokalemia 11/18/2019   Hypothyroidism (acquired) 04/17/2017   Lumbar radicular pain 06/14/2017   Migraine without status migrainosus, not intractable 09/14/2017   Nausea 05/04/2020   Polymenorrhea 06/13/2019   Reactive thrombocytosis 11/18/2019   Sore throat 10/25/2017   Spondylosis of lumbar spine 06/14/2017   Tendinitis of left rotator cuff 11/03/2017   Family History  Problem Relation Age of Onset   Heart murmur Mother    Heart attack Maternal Grandfather    Past Surgical History:  Procedure Laterality Date   CARDIAC CATHETERIZATION  2017   No stents put in    Short Social History:  Social History   Tobacco Use   Smoking status: Former    Current packs/day: 0.00    Types: Cigarettes    Quit date: 11/13/1999    Years since quitting: 24.6   Smokeless tobacco: Never  Substance Use Topics   Alcohol use: Not on file    Comment: Occasional    Allergies[1]  Current Outpatient Medications  Medication Sig Dispense  Refill   ASPIRIN  81 81 MG chewable tablet Chew 1 tablet (81 mg total) by mouth daily. 90 tablet 3   atorvastatin  (LIPITOR) 20 MG tablet Take 1 tablet (20 mg total) by mouth daily. 90 tablet 3   Calcium  Carbonate (CALCIUM  500 PO) Take by mouth daily.     cyclobenzaprine (FLEXERIL) 10 MG tablet Take 10 mg by mouth 3 (three) times daily.     diazepam (VALIUM) 5 MG tablet Take 5 mg by mouth as needed.     famotidine (PEPCID) 40 MG tablet Take 1 tablet by mouth daily.     Ferrous Sulfate (IRON) 325 (65 Fe) MG TABS Take by mouth daily.     fluticasone (FLONASE) 50 MCG/ACT nasal spray Place into the nose.     furosemide  (LASIX ) 40 MG tablet Take 1 tablet (40 mg total) by mouth daily. 90 tablet 3   levothyroxine (SYNTHROID) 75 MCG tablet Take by mouth daily.     magnesium oxide (MAG-OX) 400 MG tablet Take 400 mg by mouth daily.     montelukast (SINGULAIR) 10 MG tablet Take 10 mg by mouth at bedtime.     pantoprazole (PROTONIX) 40 MG tablet Take 40 mg by mouth daily.     potassium chloride  SA (KLOR-CON  M20) 20 MEQ tablet Take 1 tablet (20 mEq total) by mouth daily. 90 tablet 3   pregabalin (LYRICA) 150 MG capsule Take 150 mg by mouth 2 (two) times daily.  rizatriptan (MAXALT) 5 MG tablet Take by mouth daily.     No current facility-administered medications for this visit.    REVIEW OF SYSTEMS All other systems were reviewed and are negative    Objective:  Objective   Vitals:   07/12/24 1122  BP: 122/84  Pulse: 74  Resp: 16  Temp: 98.1 F (36.7 C)  TempSrc: Temporal  SpO2: 100%  Weight: 170 lb 1.6 oz (77.2 kg)  Height: 5' 6 (1.676 m)   Body mass index is 27.45 kg/m.  Physical Exam General: no acute distress Cardiac: hemodynamically stable Extremities: Mild edema bilaterally, few telangiectasias throughout calves and ankles, no true varicosities, no signs of previous ulcers Vascular:   Right: palpable DP, PT  Left: palpable DP, PT   Data: Reflux study Venous Reflux  Times  +--------------+---------+------+-----------+------------+----------------+   RIGHT        Reflux NoRefluxReflux TimeDiameter cmsComments                                  Yes                                            +--------------+---------+------+-----------+------------+----------------+   CFV          no                                                       +--------------+---------+------+-----------+------------+----------------+   FV mid        no                                                       +--------------+---------+------+-----------+------------+----------------+   GSV at [facility]    no                            0.54                       +--------------+---------+------+-----------+------------+----------------+   GSV prox thighno                            0.18                       +--------------+---------+------+-----------+------------+----------------+   GSV mid thigh no                            0.22    out of fascia      +--------------+---------+------+-----------+------------+----------------+   GSV dist thighno                            0.21    out of fascia      +--------------+---------+------+-----------+------------+----------------+   GSV at knee   no  0.24    out of fascia      +--------------+---------+------+-----------+------------+----------------+   GSV prox calf no                            0.26    out of fascia      +--------------+---------+------+-----------+------------+----------------+   GSV mid calf  no                            0.22    out of fascia      +--------------+---------+------+-----------+------------+----------------+   GSV dist calf no                            0.14    out of fascia      +--------------+---------+------+-----------+------------+----------------+   SSV prox calf no                             0.23    chronic  thrombus  +--------------+---------+------+-----------+------------+----------------+   SSV mid calf  no                            0.16                       +--------------+---------+------+-----------+------------+----------------+       Assessment/Plan:     Deauna Yaw is a 49 y.o. female with chronic venous insufficiency with C 3 disease I explained the foundation of CVI treatment of compression and elevation I recommended medical grade graduated compression stockings and intermittent leg elevation We also discussed that many patients find symptom improvement with exercise She was measured in the office for new compression stockings and also given her healthy veins handout. Follow-up with vascular as needed      Norman GORMAN Serve MD [facility]     [1]  Allergies Allergen Reactions   Nortriptyline    "

## 2024-07-12 ENCOUNTER — Encounter: Payer: Self-pay | Admitting: Vascular Surgery

## 2024-07-12 ENCOUNTER — Ambulatory Visit (HOSPITAL_COMMUNITY)
Admission: RE | Admit: 2024-07-12 | Discharge: 2024-07-12 | Disposition: A | Source: Ambulatory Visit | Attending: Surgery | Admitting: Surgery

## 2024-07-12 ENCOUNTER — Ambulatory Visit (INDEPENDENT_AMBULATORY_CARE_PROVIDER_SITE_OTHER): Admitting: Vascular Surgery

## 2024-07-12 VITALS — BP 122/84 | HR 74 | Temp 98.1°F | Resp 16 | Ht 66.0 in | Wt 170.1 lb

## 2024-07-12 DIAGNOSIS — I872 Venous insufficiency (chronic) (peripheral): Secondary | ICD-10-CM

## 2024-07-12 DIAGNOSIS — M7989 Other specified soft tissue disorders: Secondary | ICD-10-CM

## 2024-07-22 ENCOUNTER — Other Ambulatory Visit: Payer: Self-pay | Admitting: Cardiology

## 2024-07-29 MED ORDER — ATORVASTATIN CALCIUM 20 MG PO TABS: 20.0000 mg | ORAL_TABLET | Freq: Every day | ORAL | 3 refills | Status: AC

## 2024-07-29 NOTE — Telephone Encounter (Signed)
 Lipid Panel 07/09/24 (Care Everywhere)
